# Patient Record
Sex: Female | Born: 1955
Health system: Southern US, Community
[De-identification: ages and names within clinical notes are randomized; demographics above are authoritative.]

## PROBLEM LIST (undated history)

## (undated) DIAGNOSIS — J189 Pneumonia, unspecified organism: Secondary | ICD-10-CM

## (undated) DIAGNOSIS — I82409 Acute embolism and thrombosis of unspecified deep veins of unspecified lower extremity: Secondary | ICD-10-CM

## (undated) DIAGNOSIS — Z87442 Personal history of urinary calculi: Secondary | ICD-10-CM

## (undated) DIAGNOSIS — Z8709 Personal history of other diseases of the respiratory system: Secondary | ICD-10-CM

## (undated) DIAGNOSIS — I739 Peripheral vascular disease, unspecified: Secondary | ICD-10-CM

## (undated) DIAGNOSIS — J449 Chronic obstructive pulmonary disease, unspecified: Secondary | ICD-10-CM

## (undated) DIAGNOSIS — Z8719 Personal history of other diseases of the digestive system: Secondary | ICD-10-CM

## (undated) DIAGNOSIS — Z9889 Other specified postprocedural states: Secondary | ICD-10-CM

## (undated) DIAGNOSIS — I499 Cardiac arrhythmia, unspecified: Secondary | ICD-10-CM

## (undated) DIAGNOSIS — G629 Polyneuropathy, unspecified: Secondary | ICD-10-CM

## (undated) DIAGNOSIS — M1990 Unspecified osteoarthritis, unspecified site: Secondary | ICD-10-CM

## (undated) DIAGNOSIS — L500 Allergic urticaria: Secondary | ICD-10-CM

## (undated) DIAGNOSIS — I480 Paroxysmal atrial fibrillation: Secondary | ICD-10-CM

## (undated) DIAGNOSIS — R112 Nausea with vomiting, unspecified: Secondary | ICD-10-CM

## (undated) DIAGNOSIS — F329 Major depressive disorder, single episode, unspecified: Secondary | ICD-10-CM

## (undated) DIAGNOSIS — I10 Essential (primary) hypertension: Secondary | ICD-10-CM

## (undated) DIAGNOSIS — F32A Depression, unspecified: Secondary | ICD-10-CM

## (undated) DIAGNOSIS — K219 Gastro-esophageal reflux disease without esophagitis: Secondary | ICD-10-CM

## (undated) DIAGNOSIS — M7989 Other specified soft tissue disorders: Secondary | ICD-10-CM

## (undated) HISTORY — PX: CHOLECYSTECTOMY: SHX55

## (undated) HISTORY — PX: ROTATOR CUFF REPAIR: SHX139

## (undated) HISTORY — PX: ABDOMINAL HYSTERECTOMY: SHX81

## (undated) HISTORY — PX: CYSTOSCOPY: SUR368

## (undated) HISTORY — PX: CARPAL TUNNEL RELEASE: SHX101

## (undated) HISTORY — PX: ESOPHAGOGASTRODUODENOSCOPY ENDOSCOPY: SHX5814

## (undated) HISTORY — PX: APPENDECTOMY: SHX54

## (undated) HISTORY — PX: RECONSTRUCTION THUMB W/ TOE: SUR1092

---

## 1985-06-03 HISTORY — PX: BACK SURGERY: SHX140

## 1998-06-03 DIAGNOSIS — I82409 Acute embolism and thrombosis of unspecified deep veins of unspecified lower extremity: Secondary | ICD-10-CM

## 1998-06-03 HISTORY — DX: Acute embolism and thrombosis of unspecified deep veins of unspecified lower extremity: I82.409

## 1998-06-03 HISTORY — PX: ANKLE FRACTURE SURGERY: SHX122

## 1998-06-03 HISTORY — PX: KNEE ARTHROSCOPY: SHX127

## 1998-12-27 ENCOUNTER — Encounter: Payer: Self-pay | Admitting: Internal Medicine

## 1998-12-27 ENCOUNTER — Observation Stay (HOSPITAL_COMMUNITY): Admission: EM | Admit: 1998-12-27 | Discharge: 1998-12-27 | Payer: Self-pay | Admitting: Internal Medicine

## 2001-07-02 ENCOUNTER — Ambulatory Visit (HOSPITAL_COMMUNITY): Admission: RE | Admit: 2001-07-02 | Discharge: 2001-07-02 | Payer: Self-pay | Admitting: *Deleted

## 2001-07-02 ENCOUNTER — Encounter: Payer: Self-pay | Admitting: *Deleted

## 2007-04-10 ENCOUNTER — Encounter: Admission: RE | Admit: 2007-04-10 | Discharge: 2007-04-10 | Payer: Self-pay | Admitting: Unknown Physician Specialty

## 2013-05-25 ENCOUNTER — Encounter (HOSPITAL_COMMUNITY): Payer: Self-pay | Admitting: Pharmacy Technician

## 2013-05-31 ENCOUNTER — Other Ambulatory Visit: Payer: Self-pay | Admitting: Orthopedic Surgery

## 2013-06-02 ENCOUNTER — Encounter (HOSPITAL_COMMUNITY): Payer: Self-pay

## 2013-06-02 ENCOUNTER — Encounter (HOSPITAL_COMMUNITY)
Admission: RE | Admit: 2013-06-02 | Discharge: 2013-06-02 | Disposition: A | Discharge status: Home / Self Care | Payer: Commercial Managed Care - PPO | Source: Ambulatory Visit | Attending: Orthopedic Surgery | Admitting: Orthopedic Surgery

## 2013-06-02 ENCOUNTER — Ambulatory Visit (HOSPITAL_COMMUNITY)
Admission: RE | Admit: 2013-06-02 | Discharge: 2013-06-02 | Disposition: A | Discharge status: Home / Self Care | Payer: Commercial Managed Care - PPO | Source: Ambulatory Visit | Attending: Orthopedic Surgery | Admitting: Orthopedic Surgery

## 2013-06-02 DIAGNOSIS — Z01818 Encounter for other preprocedural examination: Secondary | ICD-10-CM | POA: Insufficient documentation

## 2013-06-02 DIAGNOSIS — Z01812 Encounter for preprocedural laboratory examination: Secondary | ICD-10-CM | POA: Insufficient documentation

## 2013-06-02 HISTORY — DX: Gastro-esophageal reflux disease without esophagitis: K21.9

## 2013-06-02 HISTORY — DX: Acute embolism and thrombosis of unspecified deep veins of unspecified lower extremity: I82.409

## 2013-06-02 HISTORY — DX: Other specified soft tissue disorders: M79.89

## 2013-06-02 HISTORY — DX: Allergic urticaria: L50.0

## 2013-06-02 HISTORY — DX: Personal history of other diseases of the digestive system: Z87.19

## 2013-06-02 HISTORY — DX: Pneumonia, unspecified organism: J18.9

## 2013-06-02 HISTORY — DX: Depression, unspecified: F32.A

## 2013-06-02 HISTORY — DX: Personal history of other diseases of the respiratory system: Z87.09

## 2013-06-02 HISTORY — DX: Personal history of urinary calculi: Z87.442

## 2013-06-02 HISTORY — DX: Chronic obstructive pulmonary disease, unspecified: J44.9

## 2013-06-02 HISTORY — DX: Unspecified osteoarthritis, unspecified site: M19.90

## 2013-06-02 HISTORY — DX: Nausea with vomiting, unspecified: Z98.890

## 2013-06-02 HISTORY — DX: Nausea with vomiting, unspecified: R11.2

## 2013-06-02 HISTORY — DX: Major depressive disorder, single episode, unspecified: F32.9

## 2013-06-02 HISTORY — DX: Essential (primary) hypertension: I10

## 2013-06-02 LAB — CBC
HCT: 40.5 % (ref 36.0–46.0)
Hemoglobin: 12.8 g/dL (ref 12.0–15.0)
MCH: 28 pg (ref 26.0–34.0)
MCHC: 31.6 g/dL (ref 30.0–36.0)
MCV: 88.6 fL (ref 78.0–100.0)
Platelets: 305 10*3/uL (ref 150–400)
RBC: 4.57 MIL/uL (ref 3.87–5.11)
RDW: 13.4 % (ref 11.5–15.5)
WBC: 10.1 10*3/uL (ref 4.0–10.5)

## 2013-06-02 LAB — URINALYSIS, ROUTINE W REFLEX MICROSCOPIC
Bilirubin Urine: NEGATIVE
Glucose, UA: NEGATIVE mg/dL
Hgb urine dipstick: NEGATIVE
Ketones, ur: NEGATIVE mg/dL
Leukocytes, UA: NEGATIVE
Nitrite: NEGATIVE
Protein, ur: NEGATIVE mg/dL
Specific Gravity, Urine: 1.018 (ref 1.005–1.030)
Urobilinogen, UA: 0.2 mg/dL (ref 0.0–1.0)
pH: 6 (ref 5.0–8.0)

## 2013-06-02 LAB — PROTIME-INR
INR: 0.94 (ref 0.00–1.49)
Prothrombin Time: 12.4 seconds (ref 11.6–15.2)

## 2013-06-02 LAB — COMPREHENSIVE METABOLIC PANEL
ALT: 26 U/L (ref 0–35)
AST: 17 U/L (ref 0–37)
Albumin: 3.7 g/dL (ref 3.5–5.2)
Alkaline Phosphatase: 92 U/L (ref 39–117)
BUN: 13 mg/dL (ref 6–23)
CO2: 26 mEq/L (ref 19–32)
Calcium: 9.2 mg/dL (ref 8.4–10.5)
Chloride: 99 mEq/L (ref 96–112)
Creatinine, Ser: 0.62 mg/dL (ref 0.50–1.10)
GFR calc Af Amer: >90 mL/min (ref >90)
GFR calc non Af Amer: >90 mL/min (ref >90)
Glucose, Bld: 126 mg/dL — ABNORMAL HIGH (ref 70–99)
Potassium: 4.1 mEq/L (ref 3.7–5.3)
Sodium: 139 mEq/L (ref 137–147)
Total Bilirubin: 0.4 mg/dL (ref 0.3–1.2)
Total Protein: 6.8 g/dL (ref 6.0–8.3)

## 2013-06-02 LAB — APTT: aPTT: 28 seconds (ref 24–37)

## 2013-06-02 LAB — SURGICAL PCR SCREEN
MRSA, PCR: NEGATIVE
Staphylococcus aureus: NEGATIVE

## 2013-06-02 NOTE — Progress Notes (Signed)
Surgery clearance note 05/13/13 Dr. Nedra Hai on chart, EKG 05/13/13 on chart

## 2013-06-02 NOTE — Patient Instructions (Signed)
20 Hailey Stanton  06/02/2013   Your procedure is scheduled on: 06/11/13  Report to [facility] at 6:45 AM.  Call this number if you have problems the morning of surgery [phone]: [phone]   Remember:   Do not eat food or drink liquids After Midnight.     Take these medicines the morning of surgery with A SIP OF WATER: zyrtec, prilosec   Do not wear jewelry, make-up or nail polish.  Do not wear lotions, powders, or perfumes. You may wear deodorant.  Do not shave 48 hours prior to surgery. Men may shave face and neck.  Do not bring valuables to the hospital.  Contacts, dentures or bridgework may not be worn into surgery.  Leave suitcase in the car. After surgery it may be brought to your room.  For patients admitted to the hospital, checkout time is 11:00 AM the day of discharge.    Please read over the following fact sheets that you were given: MRSA Information, incentive spirometry fact sheet, blood fact sheet Birdie Sons, RN  pre op nurse call if needed [phone]    FAILURE TO FOLLOW THESE INSTRUCTIONS MAY RESULT IN CANCELLATION OF YOUR SURGERY   Patient Signature: ___________________________________________

## 2013-06-10 ENCOUNTER — Other Ambulatory Visit: Payer: Self-pay | Admitting: Orthopedic Surgery

## 2013-06-10 MED ORDER — VANCOMYCIN HCL 10 G IV SOLR
1500.0000 mg | INTRAVENOUS | Status: AC
  Administered 2013-06-11: 1500 mg via INTRAVENOUS
  Filled 2013-06-10 (×2): qty 1500
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
## 2013-06-11 ENCOUNTER — Inpatient Hospital Stay (HOSPITAL_COMMUNITY): Payer: Commercial Managed Care - PPO | Admitting: Anesthesiology

## 2013-06-11 ENCOUNTER — Encounter (HOSPITAL_COMMUNITY): Payer: Self-pay | Admitting: *Deleted

## 2013-06-11 ENCOUNTER — Encounter (HOSPITAL_COMMUNITY): Payer: Commercial Managed Care - PPO | Admitting: Anesthesiology

## 2013-06-11 ENCOUNTER — Encounter (HOSPITAL_COMMUNITY)
Admission: RE | Disposition: A | Discharge status: Home Health | Payer: Self-pay | Source: Ambulatory Visit | Attending: Orthopedic Surgery

## 2013-06-11 ENCOUNTER — Inpatient Hospital Stay (HOSPITAL_COMMUNITY)
Admission: RE | Admit: 2013-06-11 | Discharge: 2013-06-14 | DRG: 470 | Disposition: A | Discharge status: Home Health | Payer: Commercial Managed Care - PPO | Source: Ambulatory Visit | Attending: Orthopedic Surgery | Admitting: Orthopedic Surgery

## 2013-06-11 DIAGNOSIS — Z6841 Body Mass Index (BMI) 40.0 and over, adult: Secondary | ICD-10-CM

## 2013-06-11 DIAGNOSIS — Z86718 Personal history of other venous thrombosis and embolism: Secondary | ICD-10-CM

## 2013-06-11 DIAGNOSIS — J4489 Other specified chronic obstructive pulmonary disease: Secondary | ICD-10-CM | POA: Diagnosis present

## 2013-06-11 DIAGNOSIS — M898X9 Other specified disorders of bone, unspecified site: Secondary | ICD-10-CM | POA: Diagnosis present

## 2013-06-11 DIAGNOSIS — K449 Diaphragmatic hernia without obstruction or gangrene: Secondary | ICD-10-CM | POA: Diagnosis present

## 2013-06-11 DIAGNOSIS — D62 Acute posthemorrhagic anemia: Secondary | ICD-10-CM

## 2013-06-11 DIAGNOSIS — M1710 Unilateral primary osteoarthritis, unspecified knee: Principal | ICD-10-CM | POA: Diagnosis present

## 2013-06-11 DIAGNOSIS — Z96651 Presence of right artificial knee joint: Secondary | ICD-10-CM

## 2013-06-11 DIAGNOSIS — Z87891 Personal history of nicotine dependence: Secondary | ICD-10-CM

## 2013-06-11 DIAGNOSIS — M179 Osteoarthritis of knee, unspecified: Secondary | ICD-10-CM | POA: Diagnosis present

## 2013-06-11 DIAGNOSIS — L500 Allergic urticaria: Secondary | ICD-10-CM | POA: Diagnosis present

## 2013-06-11 DIAGNOSIS — Z881 Allergy status to other antibiotic agents status: Secondary | ICD-10-CM

## 2013-06-11 DIAGNOSIS — F329 Major depressive disorder, single episode, unspecified: Secondary | ICD-10-CM | POA: Diagnosis present

## 2013-06-11 DIAGNOSIS — Z79899 Other long term (current) drug therapy: Secondary | ICD-10-CM

## 2013-06-11 DIAGNOSIS — I10 Essential (primary) hypertension: Secondary | ICD-10-CM | POA: Diagnosis present

## 2013-06-11 DIAGNOSIS — J449 Chronic obstructive pulmonary disease, unspecified: Secondary | ICD-10-CM | POA: Diagnosis present

## 2013-06-11 DIAGNOSIS — F3289 Other specified depressive episodes: Secondary | ICD-10-CM | POA: Diagnosis present

## 2013-06-11 DIAGNOSIS — K219 Gastro-esophageal reflux disease without esophagitis: Secondary | ICD-10-CM | POA: Diagnosis present

## 2013-06-11 HISTORY — DX: Osteoarthritis of knee, unspecified: M17.9

## 2013-06-11 HISTORY — PX: TOTAL KNEE ARTHROPLASTY: SHX125

## 2013-06-11 LAB — TYPE AND SCREEN
ABO/RH(D): O NEG
Antibody Screen: NEGATIVE

## 2013-06-11 LAB — ABO/RH: ABO/RH(D): O NEG

## 2013-06-11 SURGERY — ARTHROPLASTY, KNEE, TOTAL
Anesthesia: Spinal | Site: Knee | Laterality: Right

## 2013-06-11 MED ORDER — VALSARTAN-HYDROCHLOROTHIAZIDE 80-12.5 MG PO TABS: 1.0000 | ORAL_TABLET | Freq: Every day | ORAL | Status: DC

## 2013-06-11 MED ORDER — PROPOFOL 10 MG/ML IV BOLUS
INTRAVENOUS | Status: AC
  Filled 2013-06-11: qty 20

## 2013-06-11 MED ORDER — ACETAMINOPHEN 500 MG PO TABS
1000.0000 mg | ORAL_TABLET | Freq: Once | ORAL | Status: AC
  Administered 2013-06-11: 1000 mg via ORAL
  Filled 2013-06-11: qty 2

## 2013-06-11 MED ORDER — 0.9 % SODIUM CHLORIDE (POUR BTL) OPTIME
TOPICAL | Status: DC | PRN
  Administered 2013-06-11: 1000 mL

## 2013-06-11 MED ORDER — ONDANSETRON HCL 4 MG/2ML IJ SOLN
4.0000 mg | Freq: Four times a day (QID) | INTRAMUSCULAR | Status: DC | PRN
  Administered 2013-06-12: 4 mg via INTRAVENOUS
  Filled 2013-06-11: qty 2

## 2013-06-11 MED ORDER — ACETAMINOPHEN 650 MG RE SUPP: 650.0000 mg | Freq: Four times a day (QID) | RECTAL | Status: DC | PRN

## 2013-06-11 MED ORDER — DEXAMETHASONE SODIUM PHOSPHATE 10 MG/ML IJ SOLN
10.0000 mg | Freq: Every day | INTRAMUSCULAR | Status: AC
  Filled 2013-06-11: qty 1

## 2013-06-11 MED ORDER — ONDANSETRON HCL 4 MG/2ML IJ SOLN
INTRAMUSCULAR | Status: DC | PRN
  Administered 2013-06-11: 4 mg via INTRAVENOUS

## 2013-06-11 MED ORDER — DEXAMETHASONE SODIUM PHOSPHATE 10 MG/ML IJ SOLN
INTRAMUSCULAR | Status: AC
  Filled 2013-06-11: qty 1

## 2013-06-11 MED ORDER — LACTATED RINGERS IV SOLN
INTRAVENOUS | Status: DC
  Administered 2013-06-11: 1000 mL via INTRAVENOUS
  Administered 2013-06-11: 10:00:00 via INTRAVENOUS

## 2013-06-11 MED ORDER — PROPOFOL 10 MG/ML IV BOLUS
INTRAVENOUS | Status: DC | PRN
  Administered 2013-06-11: 30 mg via INTRAVENOUS

## 2013-06-11 MED ORDER — BUPIVACAINE IN DEXTROSE 0.75-8.25 % IT SOLN
INTRATHECAL | Status: DC | PRN
  Administered 2013-06-11: 2 mL via INTRATHECAL

## 2013-06-11 MED ORDER — VANCOMYCIN HCL IN DEXTROSE 1-5 GM/200ML-% IV SOLN
1000.0000 mg | Freq: Two times a day (BID) | INTRAVENOUS | Status: AC
  Administered 2013-06-11: 1000 mg via INTRAVENOUS
  Filled 2013-06-11: qty 200

## 2013-06-11 MED ORDER — NEOSTIGMINE METHYLSULFATE 1 MG/ML IJ SOLN
INTRAMUSCULAR | Status: AC
  Filled 2013-06-11: qty 10

## 2013-06-11 MED ORDER — FUROSEMIDE 20 MG PO TABS
20.0000 mg | ORAL_TABLET | Freq: Every day | ORAL | Status: DC
  Administered 2013-06-11 – 2013-06-13 (×3): 20 mg via ORAL
  Filled 2013-06-11 (×4): qty 1

## 2013-06-11 MED ORDER — PHENOL 1.4 % MT LIQD: 1.0000 | OROMUCOSAL | Status: DC | PRN

## 2013-06-11 MED ORDER — LORATADINE 10 MG PO TABS
10.0000 mg | ORAL_TABLET | Freq: Every day | ORAL | Status: DC
  Administered 2013-06-11 – 2013-06-14 (×4): 10 mg via ORAL
  Filled 2013-06-11 (×4): qty 1

## 2013-06-11 MED ORDER — PANTOPRAZOLE SODIUM 40 MG PO TBEC
40.0000 mg | DELAYED_RELEASE_TABLET | Freq: Every day | ORAL | Status: DC
  Administered 2013-06-12 – 2013-06-14 (×3): 40 mg via ORAL
  Filled 2013-06-11 (×3): qty 1

## 2013-06-11 MED ORDER — PROMETHAZINE HCL 25 MG/ML IJ SOLN: 6.2500 mg | INTRAMUSCULAR | Status: DC | PRN

## 2013-06-11 MED ORDER — BUPIVACAINE HCL 0.25 % IJ SOLN
INTRAMUSCULAR | Status: DC | PRN
  Administered 2013-06-11: 20 mL

## 2013-06-11 MED ORDER — METOCLOPRAMIDE HCL 5 MG/ML IJ SOLN: 5.0000 mg | Freq: Three times a day (TID) | INTRAMUSCULAR | Status: DC | PRN

## 2013-06-11 MED ORDER — ONDANSETRON HCL 4 MG/2ML IJ SOLN
INTRAMUSCULAR | Status: AC
  Filled 2013-06-11: qty 2

## 2013-06-11 MED ORDER — ONDANSETRON HCL 4 MG PO TABS: 4.0000 mg | ORAL_TABLET | Freq: Four times a day (QID) | ORAL | Status: DC | PRN

## 2013-06-11 MED ORDER — OXYCODONE HCL 5 MG PO TABS
5.0000 mg | ORAL_TABLET | ORAL | Status: DC | PRN
  Administered 2013-06-11: 10 mg via ORAL
  Administered 2013-06-11 (×2): 5 mg via ORAL
  Administered 2013-06-12 – 2013-06-14 (×12): 10 mg via ORAL
  Filled 2013-06-11 (×11): qty 2
  Filled 2013-06-11 (×2): qty 1
  Filled 2013-06-11 (×2): qty 2

## 2013-06-11 MED ORDER — SODIUM CHLORIDE 0.9 % IJ SOLN
INTRAMUSCULAR | Status: AC
  Filled 2013-06-11: qty 50

## 2013-06-11 MED ORDER — MORPHINE SULFATE 2 MG/ML IJ SOLN
1.0000 mg | INTRAMUSCULAR | Status: DC | PRN
  Administered 2013-06-11 (×2): 1 mg via INTRAVENOUS
  Filled 2013-06-11 (×2): qty 1

## 2013-06-11 MED ORDER — BUPIVACAINE HCL (PF) 0.25 % IJ SOLN
INTRAMUSCULAR | Status: AC
  Filled 2013-06-11: qty 30

## 2013-06-11 MED ORDER — BUPIVACAINE LIPOSOME 1.3 % IJ SUSP
INTRAMUSCULAR | Status: DC | PRN
  Administered 2013-06-11: 20 mL

## 2013-06-11 MED ORDER — LORATADINE 10 MG PO TABS: 10.0000 mg | ORAL_TABLET | Freq: Every day | ORAL | Status: DC

## 2013-06-11 MED ORDER — IRBESARTAN 75 MG PO TABS
75.0000 mg | ORAL_TABLET | Freq: Every day | ORAL | Status: DC
  Administered 2013-06-12 – 2013-06-14 (×3): 75 mg via ORAL
  Filled 2013-06-11 (×3): qty 1

## 2013-06-11 MED ORDER — BUPIVACAINE LIPOSOME 1.3 % IJ SUSP
20.0000 mL | Freq: Once | INTRAMUSCULAR | Status: DC
  Filled 2013-06-11: qty 20

## 2013-06-11 MED ORDER — SODIUM CHLORIDE 0.9 % IJ SOLN
INTRAMUSCULAR | Status: DC | PRN
  Administered 2013-06-11: 30 mL

## 2013-06-11 MED ORDER — MENTHOL 3 MG MT LOZG: 1.0000 | LOZENGE | OROMUCOSAL | Status: DC | PRN

## 2013-06-11 MED ORDER — FENTANYL CITRATE 0.05 MG/ML IJ SOLN
INTRAMUSCULAR | Status: AC
  Filled 2013-06-11: qty 5

## 2013-06-11 MED ORDER — METHOCARBAMOL 500 MG PO TABS
500.0000 mg | ORAL_TABLET | Freq: Four times a day (QID) | ORAL | Status: DC | PRN
  Administered 2013-06-11 – 2013-06-14 (×8): 500 mg via ORAL
  Filled 2013-06-11 (×9): qty 1

## 2013-06-11 MED ORDER — HYDROCHLOROTHIAZIDE 12.5 MG PO CAPS
12.5000 mg | ORAL_CAPSULE | Freq: Every day | ORAL | Status: DC
  Administered 2013-06-12 – 2013-06-14 (×3): 12.5 mg via ORAL
  Filled 2013-06-11 (×3): qty 1

## 2013-06-11 MED ORDER — DEXAMETHASONE SODIUM PHOSPHATE 10 MG/ML IJ SOLN
10.0000 mg | Freq: Once | INTRAMUSCULAR | Status: AC
  Administered 2013-06-11: 10 mg via INTRAVENOUS

## 2013-06-11 MED ORDER — METHOCARBAMOL 100 MG/ML IJ SOLN
500.0000 mg | Freq: Four times a day (QID) | INTRAVENOUS | Status: DC | PRN
  Administered 2013-06-11: 500 mg via INTRAVENOUS
  Filled 2013-06-11: qty 5

## 2013-06-11 MED ORDER — DOCUSATE SODIUM 100 MG PO CAPS
100.0000 mg | ORAL_CAPSULE | Freq: Two times a day (BID) | ORAL | Status: DC
  Administered 2013-06-11 – 2013-06-14 (×6): 100 mg via ORAL

## 2013-06-11 MED ORDER — PROPOFOL INFUSION 10 MG/ML OPTIME
INTRAVENOUS | Status: DC | PRN
  Administered 2013-06-11: 140 ug/kg/min via INTRAVENOUS

## 2013-06-11 MED ORDER — POLYETHYLENE GLYCOL 3350 17 G PO PACK
17.0000 g | PACK | Freq: Every day | ORAL | Status: DC | PRN
  Administered 2013-06-12 – 2013-06-13 (×2): 17 g via ORAL

## 2013-06-11 MED ORDER — ACETAMINOPHEN 500 MG PO TABS
1000.0000 mg | ORAL_TABLET | Freq: Four times a day (QID) | ORAL | Status: AC
  Administered 2013-06-11 – 2013-06-12 (×4): 1000 mg via ORAL
  Filled 2013-06-11 (×4): qty 2

## 2013-06-11 MED ORDER — BISACODYL 10 MG RE SUPP: 10.0000 mg | Freq: Every day | RECTAL | Status: DC | PRN

## 2013-06-11 MED ORDER — DEXAMETHASONE 6 MG PO TABS
10.0000 mg | ORAL_TABLET | Freq: Every day | ORAL | Status: AC
  Administered 2013-06-12: 10 mg via ORAL
  Filled 2013-06-11: qty 1

## 2013-06-11 MED ORDER — SODIUM CHLORIDE 0.9 % IV SOLN
INTRAVENOUS | Status: DC
  Administered 2013-06-11: 75 mL/h via INTRAVENOUS
  Administered 2013-06-12: 02:00:00 via INTRAVENOUS

## 2013-06-11 MED ORDER — ROCURONIUM BROMIDE 100 MG/10ML IV SOLN
INTRAVENOUS | Status: AC
  Filled 2013-06-11: qty 1

## 2013-06-11 MED ORDER — METOCLOPRAMIDE HCL 10 MG PO TABS
5.0000 mg | ORAL_TABLET | Freq: Three times a day (TID) | ORAL | Status: DC | PRN
  Administered 2013-06-12: 10 mg via ORAL
  Filled 2013-06-11: qty 1

## 2013-06-11 MED ORDER — DIPHENHYDRAMINE HCL 12.5 MG/5ML PO ELIX: 12.5000 mg | ORAL_SOLUTION | ORAL | Status: DC | PRN

## 2013-06-11 MED ORDER — HYDROMORPHONE HCL PF 1 MG/ML IJ SOLN
0.5000 mg | INTRAMUSCULAR | Status: DC | PRN
  Administered 2013-06-11 (×2): 0.5 mg via INTRAVENOUS
  Administered 2013-06-12 (×2): 1 mg via INTRAVENOUS
  Filled 2013-06-11 (×3): qty 1

## 2013-06-11 MED ORDER — RIVAROXABAN 10 MG PO TABS
10.0000 mg | ORAL_TABLET | Freq: Every day | ORAL | Status: DC
  Administered 2013-06-12 – 2013-06-14 (×3): 10 mg via ORAL
  Filled 2013-06-11 (×5): qty 1

## 2013-06-11 MED ORDER — HYDROMORPHONE HCL PF 1 MG/ML IJ SOLN: 0.2500 mg | INTRAMUSCULAR | Status: DC | PRN

## 2013-06-11 MED ORDER — GLYCOPYRROLATE 0.2 MG/ML IJ SOLN
INTRAMUSCULAR | Status: AC
  Filled 2013-06-11: qty 3

## 2013-06-11 MED ORDER — FLEET ENEMA 7-19 GM/118ML RE ENEM: 1.0000 | ENEMA | Freq: Once | RECTAL | Status: AC | PRN

## 2013-06-11 MED ORDER — KETOROLAC TROMETHAMINE 15 MG/ML IJ SOLN
7.5000 mg | Freq: Four times a day (QID) | INTRAMUSCULAR | Status: AC | PRN
  Administered 2013-06-11: 7.5 mg via INTRAVENOUS
  Filled 2013-06-11: qty 1

## 2013-06-11 MED ORDER — SODIUM CHLORIDE 0.9 % IV SOLN: INTRAVENOUS | Status: DC

## 2013-06-11 MED ORDER — SODIUM CHLORIDE 0.9 % IR SOLN
Status: DC | PRN
  Administered 2013-06-11: 1000 mL

## 2013-06-11 MED ORDER — ACETAMINOPHEN 325 MG PO TABS
650.0000 mg | ORAL_TABLET | Freq: Four times a day (QID) | ORAL | Status: DC | PRN
  Administered 2013-06-13: 650 mg via ORAL
  Filled 2013-06-11: qty 2

## 2013-06-11 MED ORDER — TRAMADOL HCL 50 MG PO TABS
50.0000 mg | ORAL_TABLET | Freq: Four times a day (QID) | ORAL | Status: DC | PRN
  Administered 2013-06-12 – 2013-06-13 (×2): 100 mg via ORAL
  Filled 2013-06-11 (×2): qty 2

## 2013-06-11 SURGICAL SUPPLY — 57 items
BAG ZIPLOCK 12X15 (MISCELLANEOUS) ×2 IMPLANT
BANDAGE ELASTIC 6 VELCRO ST LF (GAUZE/BANDAGES/DRESSINGS) ×2 IMPLANT
BANDAGE ESMARK 6X9 LF (GAUZE/BANDAGES/DRESSINGS) ×1 IMPLANT
BLADE SAG 18X100X1.27 (BLADE) ×2 IMPLANT
BLADE SAW SGTL 11.0X1.19X90.0M (BLADE) ×2 IMPLANT
BNDG ESMARK 6X9 LF (GAUZE/BANDAGES/DRESSINGS) ×2
BOWL SMART MIX CTS (DISPOSABLE) ×2 IMPLANT
CAP KNEE ATTUNE RP ×2 IMPLANT
CEMENT HV SMART SET (Cement) ×4 IMPLANT
CUFF TOURN SGL QUICK 34 (TOURNIQUET CUFF) ×2
CUFF TRNQT CYL 34X4X40X1 (TOURNIQUET CUFF) ×1 IMPLANT
DECANTER SPIKE VIAL GLASS SM (MISCELLANEOUS) ×2 IMPLANT
DRAPE EXTREMITY T 121X128X90 (DRAPE) ×2 IMPLANT
DRAPE POUCH INSTRU U-SHP 10X18 (DRAPES) ×2 IMPLANT
DRAPE U-SHAPE 47X51 STRL (DRAPES) ×2 IMPLANT
DRSG ADAPTIC 3X8 NADH LF (GAUZE/BANDAGES/DRESSINGS) ×2 IMPLANT
DURAPREP 26ML APPLICATOR (WOUND CARE) ×2 IMPLANT
ELECT REM PT RETURN 9FT ADLT (ELECTROSURGICAL) ×2
ELECTRODE REM PT RTRN 9FT ADLT (ELECTROSURGICAL) ×1 IMPLANT
EVACUATOR 1/8 PVC DRAIN (DRAIN) ×2 IMPLANT
FACESHIELD LNG OPTICON STERILE (SAFETY) ×10 IMPLANT
GLOVE BIO SURGEON STRL SZ7.5 (GLOVE) ×2 IMPLANT
GLOVE BIO SURGEON STRL SZ8 (GLOVE) ×2 IMPLANT
GLOVE BIOGEL PI IND STRL 7.5 (GLOVE) ×1 IMPLANT
GLOVE BIOGEL PI IND STRL 8 (GLOVE) ×2 IMPLANT
GLOVE BIOGEL PI INDICATOR 7.5 (GLOVE) ×1
GLOVE BIOGEL PI INDICATOR 8 (GLOVE) ×2
GLOVE SURG SS PI 6.5 STRL IVOR (GLOVE) IMPLANT
GLOVE SURG SS PI 7.5 STRL IVOR (GLOVE) ×6 IMPLANT
GOWN STRL REIN 3XL XLG LVL4 (GOWN DISPOSABLE) ×2 IMPLANT
GOWN STRL REUS W/TWL LRG LVL3 (GOWN DISPOSABLE) ×2 IMPLANT
GOWN STRL REUS W/TWL XL LVL3 (GOWN DISPOSABLE) ×4 IMPLANT
HANDPIECE INTERPULSE COAX TIP (DISPOSABLE) ×2
IMMOBILIZER KNEE 20 (SOFTGOODS) ×2 IMPLANT
KIT BASIN OR (CUSTOM PROCEDURE TRAY) ×2 IMPLANT
MANIFOLD NEPTUNE II (INSTRUMENTS) ×2 IMPLANT
NDL SAFETY ECLIPSE 18X1.5 (NEEDLE) ×2 IMPLANT
NEEDLE HYPO 18GX1.5 SHARP (NEEDLE) ×4
NS IRRIG 1000ML POUR BTL (IV SOLUTION) ×2 IMPLANT
PACK TOTAL JOINT (CUSTOM PROCEDURE TRAY) ×2 IMPLANT
PAD ABD 8X10 STRL (GAUZE/BANDAGES/DRESSINGS) ×2 IMPLANT
PADDING CAST COTTON 6X4 STRL (CAST SUPPLIES) ×6 IMPLANT
POSITIONER SURGICAL ARM (MISCELLANEOUS) ×2 IMPLANT
SET HNDPC FAN SPRY TIP SCT (DISPOSABLE) ×1 IMPLANT
SPONGE GAUZE 4X4 12PLY (GAUZE/BANDAGES/DRESSINGS) ×2 IMPLANT
STRIP CLOSURE SKIN 1/2X4 (GAUZE/BANDAGES/DRESSINGS) ×2 IMPLANT
SUCTION FRAZIER 12FR DISP (SUCTIONS) ×2 IMPLANT
SUT MNCRL AB 4-0 PS2 18 (SUTURE) ×2 IMPLANT
SUT VIC AB 2-0 CT1 27 (SUTURE) ×6
SUT VIC AB 2-0 CT1 TAPERPNT 27 (SUTURE) ×3 IMPLANT
SUT VLOC 180 0 24IN GS25 (SUTURE) ×2 IMPLANT
SYR 20CC LL (SYRINGE) ×2 IMPLANT
SYR 50ML LL SCALE MARK (SYRINGE) ×2 IMPLANT
TOWEL OR 17X26 10 PK STRL BLUE (TOWEL DISPOSABLE) ×4 IMPLANT
TRAY FOLEY CATH 14FRSI W/METER (CATHETERS) ×2 IMPLANT
WATER STERILE IRR 1500ML POUR (IV SOLUTION) ×4 IMPLANT
WRAP KNEE MAXI GEL POST OP (GAUZE/BANDAGES/DRESSINGS) ×2 IMPLANT

## 2013-06-11 NOTE — Anesthesia Postprocedure Evaluation (Signed)
  Anesthesia Post-op Note  Patient: Hailey Stanton  Procedure(s) Performed: Procedure(s) (LRB): RIGHT TOTAL KNEE ARTHROPLASTY (Right)  Patient Location: PACU  Anesthesia Type: Spinal  Level of Consciousness: awake and alert   Airway and Oxygen Therapy: Patient Spontanous Breathing  Post-op Pain: mild  Post-op Assessment: Post-op Vital signs reviewed, Patient's Cardiovascular Status Stable, Respiratory Function Stable, Patent Airway and No signs of Nausea or vomiting  Last Vitals:  Filed Vitals:   06/11/13 1515  BP: 109/66  Pulse: 86  Temp: 36.6 C  Resp: 16    Post-op Vital Signs: stable   Complications: No apparent anesthesia complications

## 2013-06-11 NOTE — Anesthesia Preprocedure Evaluation (Addendum)
Anesthesia Evaluation  Patient identified by MRN, date of birth, ID band Patient awake    Reviewed: Allergy & Precautions, H&P , NPO status , Patient's Chart, lab work & pertinent test results  History of Anesthesia Complications (+) PONV and history of anesthetic complications  Airway Mallampati: II TM Distance: >3 FB Neck ROM: Full    Dental no notable dental hx.    Pulmonary pneumonia -, COPDformer smoker,  CXR: No active disease. breath sounds clear to auscultation  Pulmonary exam normal       Cardiovascular hypertension, Pt. on medications Rhythm:Regular Rate:Normal     Neuro/Psych PSYCHIATRIC DISORDERS Depression Chronic back pain.    GI/Hepatic Neg liver ROS, hiatal hernia, GERD-  Medicated,  Endo/Other  Morbid obesity  Renal/GU negative Renal ROS  negative genitourinary   Musculoskeletal negative musculoskeletal ROS (+)   Abdominal (+) + obese,   Peds negative pediatric ROS (+)  Hematology negative hematology ROS (+)   Anesthesia Other Findings   Reproductive/Obstetrics negative OB ROS                          Anesthesia Physical Anesthesia Plan  ASA: III  Anesthesia Plan: Spinal   Post-op Pain Management:    Induction: Intravenous  Airway Management Planned:   Additional Equipment:   Intra-op Plan:   Post-operative Plan:   Informed Consent: I have reviewed the patients History and Physical, chart, labs and discussed the procedure including the risks, benefits and alternatives for the proposed anesthesia with the patient or authorized representative who has indicated his/her understanding and acceptance.   Dental advisory given  Plan Discussed with: CRNA  Anesthesia Plan Comments: (Discussed r/b general versus spinal. Discussed risks/benefits of spinal including headache, backache, failure, bleeding, infection, and nerve damage. Patient consents to spinal. Questions  answered. Coagulation studies and platelet count acceptable.  H/O severe PONV with general.)       Anesthesia Quick Evaluation

## 2013-06-11 NOTE — Interval H&P Note (Signed)
History and Physical Interval Note:  06/11/2013 8:34 AM  Hailey Stanton  has presented today for surgery, with the diagnosis of OA RIGHT KNEE  The various methods of treatment have been discussed with the patient and family. After consideration of risks, benefits and other options for treatment, the patient has consented to  Procedure(s): RIGHT TOTAL KNEE ARTHROPLASTY (Right) as a surgical intervention .  The patient's history has been reviewed, patient examined, no change in status, stable for surgery.  I have reviewed the patient's chart and labs.  Questions were answered to the patient's satisfaction.     Loanne DrillingALUISIO,Carden Teel V

## 2013-06-11 NOTE — Care Management Note (Addendum)
    Page 1 of 2   06/14/2013     12:50:35 PM   CARE MANAGEMENT NOTE 06/14/2013  Patient:  Hailey Stanton,Hailey Stanton   Account Number:  [id number]  Date Initiated:  06/11/2013  Documentation initiated by:  Colleen CanMANNING,Harinder Romas  Subjective/Objective Assessment:   dx tota;l knee replacemnt-rt     Action/Plan:   Surgery today. PT/OT pending. CM will follow.   Anticipated DC Date:  06/14/2013   Anticipated DC Plan:  HOME W HOME HEALTH SERVICES      DC Planning Services  CM consult      PAC Choice  DURABLE MEDICAL EQUIPMENT  HOME HEALTH   Choice offered to / List presented to:  C-1 Patient   DME arranged  [facility]  3-N-1      DME agency  [facility]     HH arranged  HH-2 PT      [facility]   Status of service:  Completed, signed off Medicare Important Message given?   (If response is "NO", the following Medicare IM given date fields will be blank) Date Medicare IM given:   Date Additional Medicare IM given:    Discharge Disposition:  HOME W HOME HEALTH SERVICES  Per UR Regulation:  Reviewed for med. necessity/level of care/duration of stay  If discussed at Long Length of Stay Meetings, dates discussed:    Comments:  06/14/2013 Colleen CanLinda Alphia Behanna BSN RN CCM [phone] Plans are for pt discharge today. She [place] spouse will be caregiver. States DME was delivered to her room and husband has already transpoirted to her home. [facility] with start date of tomorrow 01/13.   06/12/13 KATHY MAHABIR RN,BSN NCM [phone] [facility] REP JAMIE AWARE OF HHPT ORDER,& FOLLOWING FOR D/C ORDER.RECOMMENDED FOR [facility] RW,AWAIT ORDER.  POD#1 R TKA.PT-HH, [facility] RW.

## 2013-06-11 NOTE — Transfer of Care (Signed)
Immediate Anesthesia Transfer of Care Note  Patient: Hailey Stanton  Procedure(s) Performed: Procedure(s): RIGHT TOTAL KNEE ARTHROPLASTY (Right)  Patient Location: PACU  Anesthesia Type:Spinal  Level of Consciousness: awake, alert , oriented and patient cooperative  Airway & Oxygen Therapy: Patient Spontanous Breathing and Patient connected to face mask oxygen  Post-op Assessment: Report given to PACU RN and Post -op Vital signs reviewed and stable  Post vital signs: Reviewed and stable  Complications: No apparent anesthesia complications

## 2013-06-11 NOTE — Anesthesia Procedure Notes (Signed)
Spinal  Patient location during procedure: OR Staffing Anesthesiologist: Salley Scarlet Performed by: anesthesiologist  Preanesthetic Checklist Completed: patient identified, site marked, surgical consent, pre-op evaluation, timeout performed, IV checked, risks and benefits discussed and monitors and equipment checked Spinal Block Patient position: sitting Prep: Betadine Patient monitoring: heart rate, continuous pulse ox and blood pressure Location: L3-4 Injection technique: single-shot Needle Needle type: Sprotte  Needle gauge: 24 G Needle length: 10 cm Additional Notes Expiration date of kit checked and confirmed. Patient tolerated procedure well, without complications. CSF clear. No paresthesia.

## 2013-06-11 NOTE — Preoperative (Signed)
Beta Blockers   Reason not to administer Beta Blockers:Not Applicable 
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
## 2013-06-11 NOTE — Op Note (Signed)
Pre-operative diagnosis- Osteoarthritis  Right knee(s)  Post-operative diagnosis- Osteoarthritis Right knee(s)  Procedure-  Right  Total Knee Arthroplasty  Surgeon- Gus Rankin. Kip Kautzman, MD  Assistant- Avel Peace, PA-C   Anesthesia-  Spinal EBL-* No blood loss amount entered *  Drains Hemovac  Tourniquet time-  Total Tourniquet Time Documented: Thigh (Right) - 48 minutes Total: Thigh (Right) - 48 minutes    Complications- None  Condition-PACU - hemodynamically stable.   Brief Clinical Note  Hailey Stanton is a 58 y.o. 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 She has constant pain, with activity and at rest and significant functional deficits with difficulties even with ADLs. She has had extensive non-op management including analgesics, injections of cortisone and viscosupplements, and home exercise program, but remains in significant pain with significant dysfunction.Radiographs show bone on bone arthritis medial and patellofemoral. She presents now for right Total Knee Arthroplasty.    Procedure in detail---   The patient is brought into the operating room and positioned supine on the operating table. After successful administration of  Spinal,   a tourniquet is placed high on the  Right thigh(s) and the lower extremity is prepped and draped in the usual sterile fashion. Time out is performed by the operating team and then the  Right lower extremity is wrapped in Esmarch, knee flexed and the tourniquet inflated to 300 mmHg.       A midline incision is made with a ten blade through the subcutaneous tissue to the level of the extensor mechanism. A fresh blade is used to make a medial parapatellar arthrotomy. Soft tissue over the proximal medial tibia is subperiosteally elevated to the joint line with a knife and into the semimembranosus bursa with a Cobb elevator. Soft tissue over the proximal lateral tibia is elevated with  attention being paid to avoiding the patellar tendon on the tibial tubercle. The patella is everted, knee flexed 90 degrees and the ACL and PCL are removed. Findings are bone on bone medial and patellofemoral with large medial osteophytes.        The drill is used to create a starting hole in the distal femur and the canal is thoroughly irrigated with sterile saline to remove the fatty contents. The 5 degree Right  valgus alignment guide is placed into the femoral canal and the distal femoral cutting block is pinned to remove 9 mm off the distal femur. Resection is made with an oscillating saw.      The tibia is subluxed forward and the menisci are removed. The extramedullary alignment guide is placed referencing proximally at the medial aspect of the tibial tubercle and distally along the second metatarsal axis and tibial crest. The block is pinned to remove 2mm off the more deficient medial  side. Resection is made with an oscillating saw. Size 5is the most appropriate size for the tibia and the proximal tibia is prepared with the modular drill and keel punch for that size.      The femoral sizing guide is placed and size 5 is most appropriate. Rotation is marked off the epicondylar axis and confirmed by creating a rectangular flexion gap at 90 degrees. The size 5 cutting block is pinned in this rotation and the anterior, posterior and chamfer cuts are made with the oscillating saw. The intercondylar block is then placed and that cut is made.      Trial size 5 tibial component, trial size 5 posterior  stabilized femur and a 6  mm posterior stabilized rotating platform insert trial is placed. Full extension is achieved with excellent varus/valgus and anterior/posterior balance throughout full range of motion. The patella is everted and thickness measured to be 21  mm. Free hand resection is taken to 12 mm, a 35 template is placed, lug holes are drilled, trial patella is placed, and it tracks normally.  Osteophytes are removed off the posterior femur with the trial in place. All trials are removed and the cut bone surfaces prepared with pulsatile lavage. Cement is mixed and once ready for implantation, the size 5 tibial implant, size  5 posterior stabilized femoral component, and the size 35 patella are cemented in place and the patella is held with the clamp. The trial insert is placed and the knee held in full extension. The Exparel (20 ml mixed with 30 ml saline) and .25% Bupivicaine, are injected into the extensor mechanism, posterior capsule, medial and lateral gutters and subcutaneous tissues.  All extruded cement is removed and once the cement is hard the permanent 6 mm posterior stabilized rotating platform insert is placed into the tibial tray.      The wound is copiously irrigated with saline solution and the extensor mechanism closed over a hemovac drain with #1 PDS suture. The tourniquet is released for a total tourniquet time of 42  minutes. Flexion against gravity is 140 degrees and the patella tracks normally. Subcutaneous tissue is closed with 2.0 vicryl and subcuticular with running 4.0 Monocryl. The incision is cleaned and dried and steri-strips and a bulky sterile dressing are applied. The limb is placed into a knee immobilizer and the patient is awakened and transported to recovery in stable condition.      Please note that a surgical assistant was a medical necessity for this procedure in order to perform it in a safe and expeditious manner. Surgical assistant was necessary to retract the ligaments and vital neurovascular structures to prevent injury to them and also necessary for proper positioning of the limb to allow for anatomic placement of the prosthesis.   Gus RankinFrank V. Yuta Cipollone, MD    06/11/2013, 10:38 AM

## 2013-06-12 LAB — BASIC METABOLIC PANEL
BUN: 13 mg/dL (ref 6–23)
CO2: 22 mEq/L (ref 19–32)
Calcium: 8.3 mg/dL — ABNORMAL LOW (ref 8.4–10.5)
Chloride: 101 mEq/L (ref 96–112)
Creatinine, Ser: 0.61 mg/dL (ref 0.50–1.10)
GFR calc Af Amer: >90 mL/min (ref >90)
GFR calc non Af Amer: >90 mL/min (ref >90)
Glucose, Bld: 189 mg/dL — ABNORMAL HIGH (ref 70–99)
Potassium: 4.2 mEq/L (ref 3.7–5.3)
Sodium: 138 mEq/L (ref 137–147)

## 2013-06-12 LAB — CBC
HCT: 34.1 % — ABNORMAL LOW (ref 36.0–46.0)
Hemoglobin: 10.8 g/dL — ABNORMAL LOW (ref 12.0–15.0)
MCH: 28.3 pg (ref 26.0–34.0)
MCHC: 31.7 g/dL (ref 30.0–36.0)
MCV: 89.3 fL (ref 78.0–100.0)
Platelets: 297 10*3/uL (ref 150–400)
RBC: 3.82 MIL/uL — ABNORMAL LOW (ref 3.87–5.11)
RDW: 13.2 % (ref 11.5–15.5)
WBC: 16.6 10*3/uL — ABNORMAL HIGH (ref 4.0–10.5)

## 2013-06-12 MED ORDER — TRAMADOL HCL 50 MG PO TABS: 50.0000 mg | ORAL_TABLET | Freq: Four times a day (QID) | ORAL | Status: DC | PRN

## 2013-06-12 MED ORDER — METHOCARBAMOL 500 MG PO TABS: 500.0000 mg | ORAL_TABLET | Freq: Four times a day (QID) | ORAL | Status: DC | PRN

## 2013-06-12 MED ORDER — OXYCODONE HCL 5 MG PO TABS: 5.0000 mg | ORAL_TABLET | ORAL | Status: DC | PRN

## 2013-06-12 MED ORDER — RIVAROXABAN 10 MG PO TABS: 10.0000 mg | ORAL_TABLET | Freq: Every day | ORAL | Status: DC
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
## 2013-06-12 NOTE — Progress Notes (Signed)
Physical Therapy Treatment Patient Details Name: Hailey Stanton MRN: [id number] DOB: 07/15/1955 Today's Date: 06/12/2013 Time: [id number] PT Time Calculation (min): 29 min  PT Assessment / Plan / Recommendation  History of Present Illness     PT Comments     Follow Up Recommendations  Home health PT     Does the patient have the potential to tolerate intense rehabilitation     Barriers to Discharge        Equipment Recommendations  Rolling walker with 5" wheels    Recommendations for Other Services OT consult  Frequency 7X/week   Progress towards PT Goals Progress towards PT goals: Progressing toward goals  Plan Current plan remains appropriate    Precautions / Restrictions Precautions Precautions: Fall;Knee Required Braces or Orthoses: Knee Immobilizer - Right Knee Immobilizer - Right: Discontinue once straight leg raise with < 10 degree lag Restrictions Weight Bearing Restrictions: No Other Position/Activity Restrictions: WBAT   Pertinent Vitals/Pain 6/10; premed, ice packs provided    Mobility  Bed Mobility Overal bed mobility: Needs Assistance Bed Mobility: Sit to Supine Sit to supine: Min assist General bed mobility comments: cues for sequence and use of L LE to self assist - physical assist to manage R LE Transfers Overall transfer level: Needs assistance Equipment used: Rolling walker (2 wheeled) Transfers: Sit to/from Stand Sit to Stand: Min assist General transfer comment: cues for LE management and use of UEs to self assist Ambulation/Gait Ambulation/Gait assistance: Min assist Ambulation Distance (Feet): 74 Feet (and 20) Assistive device: Rolling walker (2 wheeled) Gait Pattern/deviations: Step-to pattern;Decreased step length - right;Decreased step length - left;Shuffle;Trunk flexed General Gait Details: cues for sequence, posture and position from RW    Exercises     PT Diagnosis:    PT Problem List:   PT Treatment Interventions:     PT Goals  (current goals can now be found in the care plan section) Acute Rehab PT Goals Patient Stated Goal: Resume previous lifestyle with decreaed pain PT Goal Formulation: With patient Time For Goal Achievement: 06/18/13 Potential to Achieve Goals: Good  Visit Information  Last PT Received On: 06/12/13 Assistance Needed: +1    Subjective Data  Subjective: I need to go to the bathroom Patient Stated Goal: Resume previous lifestyle with decreaed pain   Cognition  Cognition Arousal/Alertness: Awake/alert Behavior During Therapy: WFL for tasks assessed/performed Overall Cognitive Status: Within Functional Limits for tasks assessed    Balance     End of Session PT - End of Session Equipment Utilized During Treatment: Gait belt;Right knee immobilizer Activity Tolerance: Patient tolerated treatment well Patient left: in bed;with call bell/phone within reach;with family/visitor present Nurse Communication: Mobility status CPM Right Knee CPM Right Knee: Off   GP     Hailey Stanton 06/12/2013, 4:51 PM

## 2013-06-12 NOTE — Evaluation (Signed)
Physical Therapy Evaluation Patient Details Name: Hailey Stanton MRN: [id number] DOB: 04/24/1956 Today's Date: 06/12/2013 Time: [id number] PT Time Calculation (min): 36 min  PT Assessment / Plan / Recommendation History of Present Illness     Clinical Impression  Pt s/p R TKR presents with decreased R LE strength/ROM, post op pain and obesity limiting functional mobility.  Pt is motivated and should progress to d/c home with family assist and HHPT follow up.    PT Assessment  Patient needs continued PT services    Follow Up Recommendations  Home health PT    Does the patient have the potential to tolerate intense rehabilitation      Barriers to Discharge        Equipment Recommendations  Rolling walker with 5" wheels (WIDE)    Recommendations for Other Services OT consult   Frequency 7X/week    Precautions / Restrictions Precautions Precautions: Fall;Knee Required Braces or Orthoses: Knee Immobilizer - Right Knee Immobilizer - Right: Discontinue once straight leg raise with < 10 degree lag Restrictions Weight Bearing Restrictions: No Other Position/Activity Restrictions: WBAT (WBAT)   Pertinent Vitals/Pain 6/10, premed, ice packs provided      Mobility  Bed Mobility Overal bed mobility: Needs Assistance Bed Mobility: Supine to Sit Supine to sit: Mod assist General bed mobility comments: cues for sequence and assist for R LE management Transfers Overall transfer level: Needs assistance Equipment used: Rolling walker (2 wheeled) Transfers: Sit to/from Stand Sit to Stand: Mod assist General transfer comment: cues for LE management and use of UEs to self assist Ambulation/Gait Ambulation/Gait assistance: Min assist;Mod assist Ambulation Distance (Feet): 61 Feet Assistive device: Rolling walker (2 wheeled) Gait Pattern/deviations: Step-to pattern;Decreased step length - right;Decreased step length - left;Shuffle;Trunk flexed General Gait Details: cues for  sequence, posture and position from RW    Exercises Total Joint Exercises Ankle Circles/Pumps: AROM;Both;10 reps;Supine Quad Sets: AROM;Both;10 reps;Supine Heel Slides: AAROM;Right;Supine;10 reps Straight Leg Raises: AAROM;Right;10 reps;Supine   PT Diagnosis: Difficulty walking  PT Problem List: Decreased strength;Decreased range of motion;Decreased activity tolerance;Decreased mobility;Decreased knowledge of use of DME;Obesity;Pain PT Treatment Interventions: DME instruction;Gait training;Stair training;Functional mobility training;Therapeutic activities;Therapeutic exercise;Patient/family education     PT Goals(Current goals can be found in the care plan section) Acute Rehab PT Goals Patient Stated Goal: Resume previous lifestyle with decreaed pain PT Goal Formulation: With patient Time For Goal Achievement: 06/18/13 Potential to Achieve Goals: Good  Visit Information  Last PT Received On: 06/12/13 Assistance Needed: +1       Prior Functioning  Home Living Family/patient expects to be discharged to:: Private residence Living Arrangements: Spouse/significant other Available Help at Discharge: Family Type of Home: Mobile home Home Access: Stairs to enter Secretary/administratorntrance Stairs-Number of Steps: 4 Entrance Stairs-Rails: Left;Right Home Layout: One level Home Equipment: Environmental consultantWalker - 2 wheels Additional Comments: RW is likely too narrow for pt Prior Function Level of Independence: Independent Communication Communication: No difficulties Dominant Hand: Right    Cognition  Cognition Arousal/Alertness: Awake/alert Behavior During Therapy: WFL for tasks assessed/performed Overall Cognitive Status: Within Functional Limits for tasks assessed    Extremity/Trunk Assessment Upper Extremity Assessment Upper Extremity Assessment: Overall WFL for tasks assessed Lower Extremity Assessment Lower Extremity Assessment: RLE deficits/detail RLE Deficits / Details: quads 2+/5 with AAROM at knee  -10 - 40 Cervical / Trunk Assessment Cervical / Trunk Assessment: Normal   Balance    End of Session PT - End of Session Equipment Utilized During Treatment: Gait belt;Right knee immobilizer Activity Tolerance: Patient  tolerated treatment well Patient left: in chair;with call bell/phone within reach;with family/visitor present Nurse Communication: Mobility status  GP     Ferd Horrigan 06/12/2013, 12:52 PM

## 2013-06-13 LAB — BASIC METABOLIC PANEL
BUN: 14 mg/dL (ref 6–23)
CO2: 28 mEq/L (ref 19–32)
Calcium: 8.4 mg/dL (ref 8.4–10.5)
Chloride: 98 mEq/L (ref 96–112)
Creatinine, Ser: 0.62 mg/dL (ref 0.50–1.10)
GFR calc Af Amer: >90 mL/min (ref >90)
GFR calc non Af Amer: >90 mL/min (ref >90)
Glucose, Bld: 153 mg/dL — ABNORMAL HIGH (ref 70–99)
Potassium: 4.1 mEq/L (ref 3.7–5.3)
Sodium: 137 mEq/L (ref 137–147)

## 2013-06-13 LAB — CBC
HCT: 31.6 % — ABNORMAL LOW (ref 36.0–46.0)
Hemoglobin: 10.2 g/dL — ABNORMAL LOW (ref 12.0–15.0)
MCH: 28.7 pg (ref 26.0–34.0)
MCHC: 32.3 g/dL (ref 30.0–36.0)
MCV: 88.8 fL (ref 78.0–100.0)
Platelets: 271 10*3/uL (ref 150–400)
RBC: 3.56 MIL/uL — ABNORMAL LOW (ref 3.87–5.11)
RDW: 13.4 % (ref 11.5–15.5)
WBC: 17.8 10*3/uL — ABNORMAL HIGH (ref 4.0–10.5)

## 2013-06-13 NOTE — Evaluation (Signed)
Occupational Therapy Evaluation Patient Details Name: Hailey Stanton MRN: [id number] DOB: 10/13/1955 Today's Date: 06/13/2013 Time: [id number] OT Time Calculation (min): 25 min  OT Assessment / Plan / Recommendation History of present illness Pt s/p R TKR presents with decreased I with ADL activity  post op pain and obesity limiting functional mobility.  Pt is motivated and should progress to d/c home with family assist and HHOT follow up.   Clinical Impression   Pt presents to OT with decreased I with ADL activity and will benefit from skilled OT to increase I with ADL activity prior to DC home      Follow Up Recommendations  Home health OT       Equipment Recommendations  3 in 1 bedside comode (will likely need wide 3 n 1)          Precautions / Restrictions Precautions Precautions: Fall;Knee Required Braces or Orthoses: Knee Immobilizer - Right Knee Immobilizer - Right: Discontinue once straight leg raise with < 10 degree lag Restrictions Weight Bearing Restrictions: No Other Position/Activity Restrictions: WBAT       ADL  Grooming: Set up Where Assessed - Grooming: Unsupported sitting Upper Body Bathing: Set up Where Assessed - Upper Body Bathing: Unsupported sitting Lower Body Bathing: Moderate assistance Where Assessed - Lower Body Bathing: Unsupported sit to stand Upper Body Dressing: Set up Where Assessed - Upper Body Dressing: Unsupported sitting Lower Body Dressing: Moderate assistance Where Assessed - Lower Body Dressing: Unsupported sit to stand Toilet Transfer Method: Sit to stand Toileting - Clothing Manipulation and Hygiene: Minimal assistance Where Assessed - Toileting Clothing Manipulation and Hygiene: Standing ADL Comments: Pt will benefit from AE to increase I with dressing.  Wil show pt  AE next OT visit    OT Diagnosis:    OT Problem List:   OT Treatment Interventions:     OT Goals(Current goals can be found in the care plan section) Acute Rehab  OT Goals Patient Stated Goal: Resume previous lifestyle with decreased OT Goal Formulation: With patient Time For Goal Achievement: 06/27/13 Potential to Achieve Goals: Good ADL Goals Pt Will Perform Lower Body Bathing: with supervision;sit to/from stand Pt Will Perform Lower Body Dressing: with supervision;sit to/from stand Pt Will Transfer to Toilet: with supervision;ambulating;bedside commode Pt Will Perform Toileting - Clothing Manipulation and hygiene: with supervision;sit to/from stand  Visit Information  History of Present Illness: Pt s/p R TKR presents with decreased I with ADL activity  post op pain and obesity limiting functional mobility.  Pt is motivated and should progress to d/c home with family assist and HHOT follow up.       Prior Functioning     Home Living Family/patient expects to be discharged to:: Private residence Living Arrangements: Spouse/significant other Available Help at Discharge: Family Type of Home: Mobile home Home Access: Stairs to enter Secretary/administratorntrance Stairs-Number of Steps: 4 Entrance Stairs-Rails: Left;Right Home Layout: One level Home Equipment: Environmental consultantWalker - 2 wheels Additional Comments: RW is likely too narrow for pt Prior Function Level of Independence: Independent Communication Communication: No difficulties Dominant Hand: Right         Vision/Perception Vision - History Patient Visual Report: No change from baseline   Cognition  Cognition Arousal/Alertness: Awake/alert Behavior During Therapy: WFL for tasks assessed/performed Overall Cognitive Status: Within Functional Limits for tasks assessed    Extremity/Trunk Assessment Lower Extremity Assessment RLE Deficits / Details: quads 2+/5 with AAROM at knee -10 - 40 Cervical / Trunk Assessment Cervical / Trunk Assessment: Normal  Mobility Bed Mobility Overal bed mobility: Needs Assistance Bed Mobility: Sit to Supine Supine to sit: Min guard Transfers Overall transfer level:  Needs assistance Equipment used: Rolling walker (2 wheeled) Transfers: Sit to/from [employer] Sit to Stand: Supervision Stand pivot transfers: Supervision     Exercise     Balance     End of Session OT - End of Session Activity Tolerance: Patient tolerated treatment well Patient left: in chair;with call bell/phone within reach;with family/visitor present Nurse Communication: Mobility status CPM Right Knee CPM Right Knee: Off  GO     Alba Cory 06/13/2013, 11:21 AM

## 2013-06-13 NOTE — Progress Notes (Signed)
Subjective: 2 Days Post-Op Procedure(s) (LRB): RIGHT TOTAL KNEE ARTHROPLASTY (Right) Patient reports pain as 6 on 0-10 scale.  Seems fairly comfortable.   Objective: Vital signs in last 24 hours: Temp:  [97.7 F (36.5 C)-99 F (37.2 C)] 99 F (37.2 C) (01/11 0500) Pulse Rate:  [73-93] 80 (01/11 0500) Resp:  [14-16] 16 (01/11 0500) BP: (111-173)/(66-87) 152/77 mmHg (01/11 0500) SpO2:  [93 %-95 %] 95 % (01/11 0500)  Intake/Output from previous day: 01/10 0701 - 01/11 0700 In: 2510 [P.O.:600; I.V.:1910] Out: 2450 [Urine:2450] Intake/Output this shift:     Recent Labs  06/12/13 0535 06/13/13 0524  HGB 10.8* 10.2*    Recent Labs  06/12/13 0535 06/13/13 0524  WBC 16.6* 17.8*  RBC 3.82* 3.56*  HCT 34.1* 31.6*  PLT 297 271    Recent Labs  06/12/13 0535 06/13/13 0524  NA 138 137  K 4.2 4.1  CL 101 98  CO2 22 28  BUN 13 14  CREATININE 0.61 0.62  GLUCOSE 189* 153*  CALCIUM 8.3* 8.4   No results found for this basename: LABPT, INR,  in the last 72 hours  Incision: no drainage wound looks good calf neg  Assessment/Plan: 2 Days Post-Op Procedure(s) (LRB): RIGHT TOTAL KNEE ARTHROPLASTY (Right) Up with therapy States not ready for home  Continue current Tx    D/C in am  Talked with patient and husband  Hailey Stanton ANDREW 06/13/2013, 9:23 AM

## 2013-06-13 NOTE — Progress Notes (Signed)
Physical Therapy Treatment Patient Details Name: Hailey Stanton MRN: [id number] DOB: 04/02/1956 Today's Date: 06/13/2013 Time: [id number] PT Time Calculation (min): 25 min  PT Assessment / Plan / Recommendation  History of Present Illness Pt s/p R TKR presents with decreased I with ADL activity  post op pain and obesity limiting functional mobility.  Pt is motivated and should progress to d/c home with family assist and HHOT follow up.   PT Comments     Follow Up Recommendations  Home health PT     Does the patient have the potential to tolerate intense rehabilitation     Barriers to Discharge        Equipment Recommendations  Rolling walker with 5" wheels    Recommendations for Other Services OT consult  Frequency 7X/week   Progress towards PT Goals Progress towards PT goals: Progressing toward goals  Plan Current plan remains appropriate    Precautions / Restrictions Precautions Precautions: Fall;Knee Required Braces or Orthoses: Knee Immobilizer - Right Knee Immobilizer - Right: Discontinue once straight leg raise with < 10 degree lag Restrictions Weight Bearing Restrictions: No Other Position/Activity Restrictions: WBAT   Pertinent Vitals/Pain 8/10; premed, ice packs provided    Mobility  Bed Mobility Overal bed mobility: Needs Assistance Bed Mobility: Sit to Supine Supine to sit: Min guard Transfers Overall transfer level: Needs assistance Equipment used: Rolling walker (2 wheeled) Transfers: Sit to/from Stand Sit to Stand: Min guard Stand pivot transfers: Supervision General transfer comment: cues for LE management and use of UEs to self assist Ambulation/Gait Ambulation/Gait assistance: Min assist;Min guard Ambulation Distance (Feet): 123 Feet Assistive device: Rolling walker (2 wheeled) Gait Pattern/deviations: Step-to pattern;Decreased step length - right;Decreased step length - left;Trunk flexed;Shuffle General Gait Details: cues for sequence, posture  and position from RW    Exercises Total Joint Exercises Ankle Circles/Pumps: AROM;Both;Supine;20 reps Quad Sets: AROM;Both;Supine;20 reps Heel Slides: AAROM;Right;Supine;20 reps Straight Leg Raises: AAROM;Right;Supine;20 reps Goniometric ROM: AAROM at knee -8 - 40   PT Diagnosis:    PT Problem List:   PT Treatment Interventions:     PT Goals (current goals can now be found in the care plan section) Acute Rehab PT Goals Patient Stated Goal: Resume previous lifestyle with decreased PT Goal Formulation: With patient Time For Goal Achievement: 06/18/13 Potential to Achieve Goals: Good  Visit Information  Last PT Received On: 06/13/13 Assistance Needed: +1 History of Present Illness: Pt s/p R TKR presents with decreased I with ADL activity  post op pain and obesity limiting functional mobility.  Pt is motivated and should progress to d/c home with family assist and HHOT follow up.    Subjective Data  Patient Stated Goal: Resume previous lifestyle with decreased   Cognition  Cognition Arousal/Alertness: Awake/alert Behavior During Therapy: WFL for tasks assessed/performed Overall Cognitive Status: Within Functional Limits for tasks assessed    Balance     End of Session PT - End of Session Equipment Utilized During Treatment: Gait belt;Right knee immobilizer Activity Tolerance: Patient tolerated treatment well;Patient limited by pain Patient left: in chair;with call bell/phone within reach;with family/visitor present Nurse Communication: Mobility status CPM Right Knee CPM Right Knee: Off   GP     Hailey Stanton 06/13/2013, 1:42 PM

## 2013-06-13 NOTE — Progress Notes (Signed)
Physical Therapy Treatment Patient Details Name: Hailey SeverinBrenda K Filyaw MRN: [id number] DOB: 01/11/1956 Today's Date: 06/13/2013 Time: [id number] PT Time Calculation (min): 20 min  PT Assessment / Plan / Recommendation  History of Present Illness Pt s/p R TKR presents with decreased I with ADL activity  post op pain and obesity limiting functional mobility.  Pt is motivated and should progress to d/c home with family assist and HHOT follow up.   PT Comments     Follow Up Recommendations  Home health PT     Does the patient have the potential to tolerate intense rehabilitation     Barriers to Discharge        Equipment Recommendations  Rolling walker with 5" wheels    Recommendations for Other Services OT consult  Frequency 7X/week   Progress towards PT Goals Progress towards PT goals: Progressing toward goals  Plan Current plan remains appropriate    Precautions / Restrictions Precautions Precautions: Fall;Knee Required Braces or Orthoses: Knee Immobilizer - Right Knee Immobilizer - Right: Discontinue once straight leg raise with < 10 degree lag Restrictions Weight Bearing Restrictions: No Other Position/Activity Restrictions: WBAT   Pertinent Vitals/Pain 8/10; meds received during session.    Mobility  Bed Mobility Overal bed mobility: Needs Assistance Bed Mobility: Sit to Supine Supine to sit: Min guard Transfers Overall transfer level: Needs assistance Equipment used: Rolling walker (2 wheeled) Transfers: Sit to/from Stand Sit to Stand: Min guard Stand pivot transfers: Supervision General transfer comment: cues for LE management and use of UEs to self assist Ambulation/Gait Ambulation/Gait assistance: Min assist;Min guard Ambulation Distance (Feet): 123 Feet Assistive device: Rolling walker (2 wheeled) Gait Pattern/deviations: Step-to pattern;Decreased step length - right;Decreased step length - left;Trunk flexed;Shuffle General Gait Details: cues for sequence,  posture and position from RW    Exercises     PT Diagnosis:    PT Problem List:   PT Treatment Interventions:     PT Goals (current goals can now be found in the care plan section) Acute Rehab PT Goals Patient Stated Goal: Resume previous lifestyle with decreased PT Goal Formulation: With patient Time For Goal Achievement: 06/18/13 Potential to Achieve Goals: Good  Visit Information  Last PT Received On: 06/13/13 Assistance Needed: +1 History of Present Illness: Pt s/p R TKR presents with decreased I with ADL activity  post op pain and obesity limiting functional mobility.  Pt is motivated and should progress to d/c home with family assist and HHOT follow up.    Subjective Data  Patient Stated Goal: Resume previous lifestyle with decreased   Cognition  Cognition Arousal/Alertness: Awake/alert Behavior During Therapy: WFL for tasks assessed/performed Overall Cognitive Status: Within Functional Limits for tasks assessed    Balance     End of Session PT - End of Session Equipment Utilized During Treatment: Gait belt;Right knee immobilizer Activity Tolerance: Patient tolerated treatment well;Patient limited by pain Patient left: in chair;with call bell/phone within reach;with family/visitor present Nurse Communication: Mobility status CPM Right Knee CPM Right Knee: Off   GP     Eloyce Bultman 06/13/2013, 1:40 PM

## 2013-06-13 NOTE — Plan of Care (Signed)
Problem: Phase III Progression Outcomes Goal: Anticoagulant follow-up in place Outcome: Not Applicable Date Met:  82/60/88 xarelto

## 2013-06-14 ENCOUNTER — Encounter (HOSPITAL_COMMUNITY): Payer: Self-pay | Admitting: Orthopedic Surgery

## 2013-06-14 LAB — CBC
HCT: 29.4 % — ABNORMAL LOW (ref 36.0–46.0)
Hemoglobin: 9.4 g/dL — ABNORMAL LOW (ref 12.0–15.0)
MCH: 28.4 pg (ref 26.0–34.0)
MCHC: 32 g/dL (ref 30.0–36.0)
MCV: 88.8 fL (ref 78.0–100.0)
Platelets: 278 10*3/uL (ref 150–400)
RBC: 3.31 MIL/uL — ABNORMAL LOW (ref 3.87–5.11)
RDW: 13.8 % (ref 11.5–15.5)
WBC: 12.7 10*3/uL — ABNORMAL HIGH (ref 4.0–10.5)

## 2013-06-14 MED ORDER — METHOCARBAMOL 500 MG PO TABS: 500.0000 mg | ORAL_TABLET | Freq: Four times a day (QID) | ORAL | Status: DC | PRN

## 2013-06-14 MED ORDER — TRAMADOL HCL 50 MG PO TABS: 50.0000 mg | ORAL_TABLET | Freq: Four times a day (QID) | ORAL | Status: DC | PRN

## 2013-06-14 MED ORDER — OXYCODONE HCL 5 MG PO TABS: 5.0000 mg | ORAL_TABLET | ORAL | Status: DC | PRN

## 2013-06-14 MED ORDER — RIVAROXABAN 10 MG PO TABS: 10.0000 mg | ORAL_TABLET | Freq: Every day | ORAL | Status: DC

## 2013-06-14 NOTE — Discharge Instructions (Signed)
° °Dr. Frank Aluisio °Total Joint Specialist °[facility] °[address] °[place], [place] [address] °[phone] ° °TOTAL KNEE REPLACEMENT POSTOPERATIVE DIRECTIONS ° ° ° °Knee Rehabilitation, Guidelines Following Surgery  °Results after knee surgery are often greatly improved when you follow the exercise, range of motion and muscle strengthening exercises prescribed by your doctor. Safety measures are also important to protect the knee from further injury. Any time any of these exercises cause you to have increased pain or swelling in your knee joint, decrease the amount until you are comfortable again and slowly increase them. If you have problems or questions, call your caregiver or physical therapist for advice.  ° °HOME CARE INSTRUCTIONS  °Remove items at home which could result in a fall. This includes throw rugs or furniture in walking pathways.  °Continue medications as instructed at time of discharge. °You may have some home medications which will be placed on hold until you complete the course of blood thinner medication.  °You may start showering once you are discharged home but do not submerge the incision under water. Just pat the incision dry and apply a dry gauze dressing on daily. °Walk with walker as instructed.  °You may resume a sexual relationship in one month or when given the OK by  your doctor.  °· Use walker as long as suggested by your caregivers. °· Avoid periods of inactivity such as sitting longer than an hour when not asleep. This helps prevent blood clots.  °You may put full weight on your legs and walk as much as is comfortable.  °You may return to work once you are cleared by your doctor.  °Do not drive a car for 6 weeks or until released by you surgeon.  °· Do not drive while taking narcotics.  °Wear the elastic stockings for three weeks following surgery during the day but you may remove then at night. °Make sure you keep all of your appointments after your  operation with all of your doctors and caregivers. You should call the office at the above phone number and make an appointment for approximately two weeks after the date of your surgery. °Change the dressing daily and reapply a dry dressing each time. °Please pick up a stool softener and laxative for home use as long as you are requiring pain medications. °· Continue to use ice on the knee for pain and swelling from surgery. You may notice swelling that will progress down to the foot and ankle.  This is normal after surgery.  Elevate the leg when you are not up walking on it.   °It is important for you to complete the blood thinner medication as prescribed by your doctor. °· Continue to use the breathing machine which will help keep your temperature down.  It is common for your temperature to cycle up and down following surgery, especially at night when you are not up moving around and exerting yourself.  The breathing machine keeps your lungs expanded and your temperature down. ° °RANGE OF MOTION AND STRENGTHENING EXERCISES  °Rehabilitation of the knee is important following a knee injury or an operation. After just a few days of immobilization, the muscles of the thigh which control the knee become weakened and shrink (atrophy). Knee exercises are designed to build up the tone and strength of the thigh muscles and to improve knee motion. Often times heat used for twenty to thirty minutes before working out will loosen up your tissues and help with improving the   range of motion but do not use heat for the first two weeks following surgery. These exercises can be done on a training (exercise) mat, on the floor, on a table or on a bed. Use what ever works the best and is most comfortable for you Knee exercises include:  °Leg Lifts - While your knee is still immobilized in a splint or cast, you can do straight leg raises. Lift the leg to 60 degrees, hold for 3 sec, and slowly lower the leg. Repeat 10-20 times 2-3  times daily. Perform this exercise against resistance later as your knee gets better.  °Quad and Hamstring Sets - Tighten up the muscle on the front of the thigh (Quad) and hold for 5-10 sec. Repeat this 10-20 times hourly. Hamstring sets are done by pushing the foot backward against an object and holding for 5-10 sec. Repeat as with quad sets.  °A rehabilitation program following serious knee injuries can speed recovery and prevent re-injury in the future due to weakened muscles. Contact your doctor or a physical therapist for more information on knee rehabilitation.  ° °SKILLED REHAB INSTRUCTIONS: °If the patient is transferred to a skilled rehab facility following release from the hospital, a list of the current medications will be sent to the facility for the patient to continue.  When discharged from the skilled rehab facility, please have the facility set up the patient's Home Health Physical Therapy prior to being released. Also, the skilled facility will be responsible for providing the patient with their medications at time of release from the facility to include their pain medication, the muscle relaxants, and their blood thinner medication. If the patient is still at the rehab facility at time of the two week follow up appointment, the skilled rehab facility will also need to assist the patient in arranging follow up appointment in our office and any transportation needs. ° °MAKE SURE YOU:  °Understand these instructions.  °Will watch your condition.  °Will get help right away if you are not doing well or get worse.  ° ° °Pick up stool softner and laxative for home. °Do not submerge incision under water. °May shower. °Continue to use ice for pain and swelling from surgery. ° °Take Xarelto for two and a half more weeks, then discontinue Xarelto. °Once the patient has completed the blood thinner regimen, then take a Baby 81 mg Aspirin daily for four more weeks. ° ° ° ° ° ° ° ° ° °
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
## 2013-06-14 NOTE — Progress Notes (Signed)
Occupational Therapy Treatment Patient Details Name: Hailey Stanton MRN: [id number] DOB: 03/05/1956 Today's Date: 06/14/2013 Time: [id number] OT Time Calculation (min): 16 min  OT Assessment / Plan / Recommendation  History of present illness Pt s/p R TKR presents with decreased I with ADL activity  post op pain and obesity limiting functional mobility.  Pt is motivated and should progress to d/c home with family assist and HHOT follow up.   OT comments  Ready to go home from OT standpoint.  Husband will A with ADL activity as needed.  Education complete                 Progress towards OT Goals Progress towards OT goals: Progressing toward goals  Plan Discharge plan remains appropriate    Precautions / Restrictions Precautions Precautions: Fall;Knee Required Braces or Orthoses: Knee Immobilizer - Right Knee Immobilizer - Right: Discontinue once straight leg raise with < 10 degree lag Restrictions Weight Bearing Restrictions: No Other Position/Activity Restrictions: WBAT       ADL  Lower Body Dressing: Moderate assistance Where Assessed - Lower Body Dressing: Unsupported sit to stand Toilet Transfer: Min Pension scheme managerguard Toilet Transfer Method: Sit to [place] Toilet Transfer Equipment: Comfort height toilet Toileting - Clothing Manipulation and Hygiene: Min guard Where Assessed - Toileting Clothing Manipulation and Hygiene: Standing ADL Comments: Husband will purchase sock aide for pt. Pt has reacher to A with LB dressing      OT Goals(current goals can now be found in the care plan section)    Visit Information  Last OT Received On: 06/14/13 Assistance Needed: +1 History of Present Illness: Pt s/p R TKR presents with decreased I with ADL activity  post op pain and obesity limiting functional mobility.  Pt is motivated and should progress to d/c home with family assist and HHOT follow up.          Cognition  Cognition Arousal/Alertness: Awake/alert Behavior During Therapy: WFL  for tasks assessed/performed Overall Cognitive Status: Within Functional Limits for tasks assessed    Mobility  Bed Mobility Overal bed mobility: Needs Assistance Bed Mobility: Supine to Sit Supine to sit: Min assist Transfers Overall transfer level: Needs assistance Equipment used: Rolling walker (2 wheeled) Transfers: Sit to/from Stand Sit to Stand: Supervision Stand pivot transfers: Supervision          End of Session OT - End of Session Activity Tolerance: Patient tolerated treatment well Patient left: in bed;with family/visitor present;with call bell/phone within reach Nurse Communication: Mobility status  GO     Alba CoryREDDING, Tamiah Dysart D 06/14/2013, 9:25 AM

## 2013-06-14 NOTE — Progress Notes (Signed)
Physical Therapy Treatment Patient Details Name: Hailey Stanton MRN: [id number] DOB: 06/07/1955 Today's Date: 06/14/2013 Time: [id number] PT Time Calculation (min): 53 min  PT Assessment / Plan / Recommendation  History of Present Illness Pt s/p R TKR presents with decreased I with ADL activity  post op pain and obesity limiting functional mobility.  Pt is motivated and should progress to d/c home with family assist and HHOT follow up.   PT Comments   Ready for d/c from PT perspective  Follow Up Recommendations  Home health PT     Does the patient have the potential to tolerate intense rehabilitation     Barriers to Discharge        Equipment Recommendations  Rolling walker with 5" wheels    Recommendations for Other Services OT consult  Frequency 7X/week   Progress towards PT Goals Progress towards PT goals: Progressing toward goals  Plan Current plan remains appropriate    Precautions / Restrictions Precautions Precautions: Fall;Knee Required Braces or Orthoses: Knee Immobilizer - Right Knee Immobilizer - Right: Discontinue once straight leg raise with < 10 degree lag Restrictions Weight Bearing Restrictions: No Other Position/Activity Restrictions: WBAT   Pertinent Vitals/Pain 7/10; premed, ice packs provided    Mobility  Bed Mobility Overal bed mobility: Needs Assistance Bed Mobility: Supine to Sit Supine to sit: Min assist General bed mobility comments: min assist with R LE - husband assisting Transfers Overall transfer level: Needs assistance Equipment used: Rolling walker (2 wheeled) Transfers: Sit to/from Stand Sit to Stand: Supervision Stand pivot transfers: Supervision General transfer comment: min cues for LE management  Ambulation/Gait Ambulation/Gait assistance: Min guard;Supervision Ambulation Distance (Feet): 130 Feet (and 50 and 20x 2) Assistive device: Rolling walker (2 wheeled) Gait Pattern/deviations: Step-to pattern;Shuffle;Trunk  flexed General Gait Details: cues for sequence, posture and position from RW Stairs: Yes Stairs assistance: Min assist Stair Management: One rail Right;Step to pattern;Forwards;With crutches Number of Stairs: 4 General stair comments: cues for sequence and foot/crutch placement    Exercises Total Joint Exercises Ankle Circles/Pumps: AROM;Both;Supine;20 reps Quad Sets: AROM;Both;Supine;20 reps Short Arc QuadBarbaraann Boys: AAROM;Both;10 reps;Supine Heel Slides: AAROM;Right;Supine;20 reps Straight Leg Raises: AAROM;Right;Supine;20 reps   PT Diagnosis:    PT Problem List:   PT Treatment Interventions:     PT Goals (current goals can now be found in the care plan section) Acute Rehab PT Goals Patient Stated Goal: Resume previous lifestyle with decreased PT Goal Formulation: With patient Time For Goal Achievement: 06/18/13 Potential to Achieve Goals: Good  Visit Information  Last PT Received On: 06/14/13 Assistance Needed: +1 History of Present Illness: Pt s/p R TKR presents with decreased I with ADL activity  post op pain and obesity limiting functional mobility.  Pt is motivated and should progress to d/c home with family assist and HHOT follow up.    Subjective Data  Subjective: I'm doing ok Patient Stated Goal: Resume previous lifestyle with decreased   Cognition  Cognition Arousal/Alertness: Awake/alert Behavior During Therapy: WFL for tasks assessed/performed Overall Cognitive Status: Within Functional Limits for tasks assessed    Balance     End of Session PT - End of Session Equipment Utilized During Treatment: Gait belt;Right knee immobilizer Activity Tolerance: Patient tolerated treatment well Patient left: in bed;with call bell/phone within reach;with family/visitor present Nurse Communication: Mobility status   GP     Hailey Stanton 06/14/2013, 9:49 AM
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
## 2013-06-17 DIAGNOSIS — D62 Acute posthemorrhagic anemia: Secondary | ICD-10-CM

## 2013-06-17 HISTORY — DX: Acute posthemorrhagic anemia: D62

## 2015-04-02 IMAGING — CR DG CHEST 2V
3 series · 3 of 3 positions shown · non-contrast
Comparison: 07/06/2007

CLINICAL DATA: Preoperative knee replacement

EXAM:
CHEST  2 VIEW

[w chest pa]
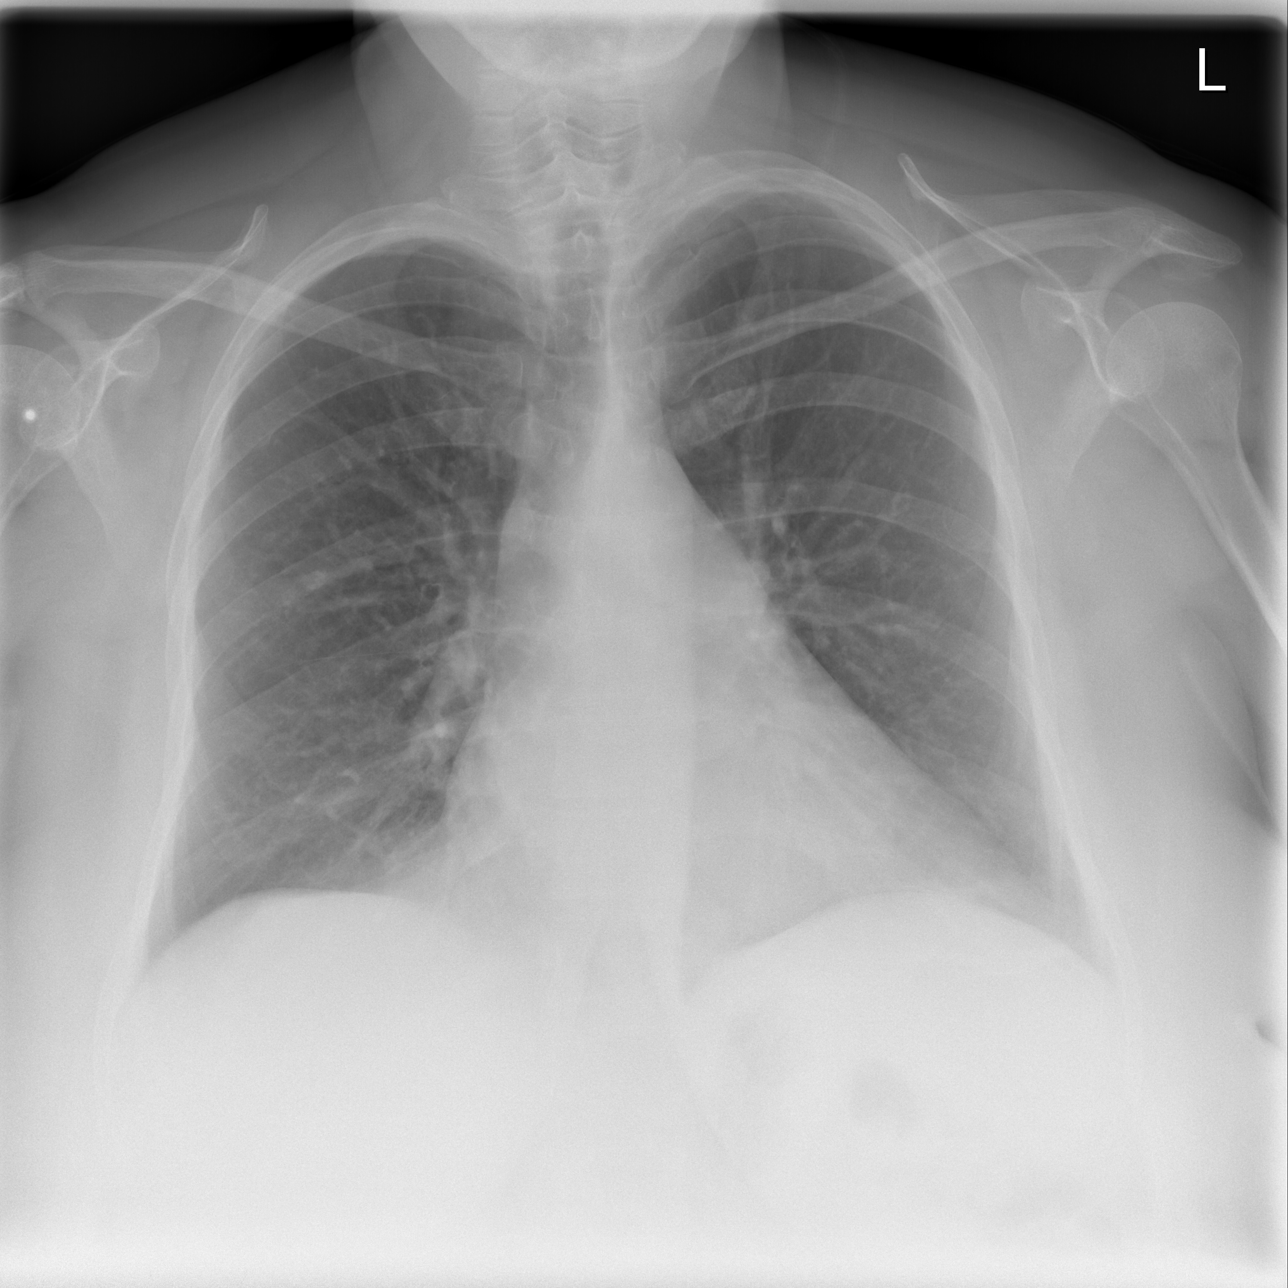

[w chest lat (1 of 2)]
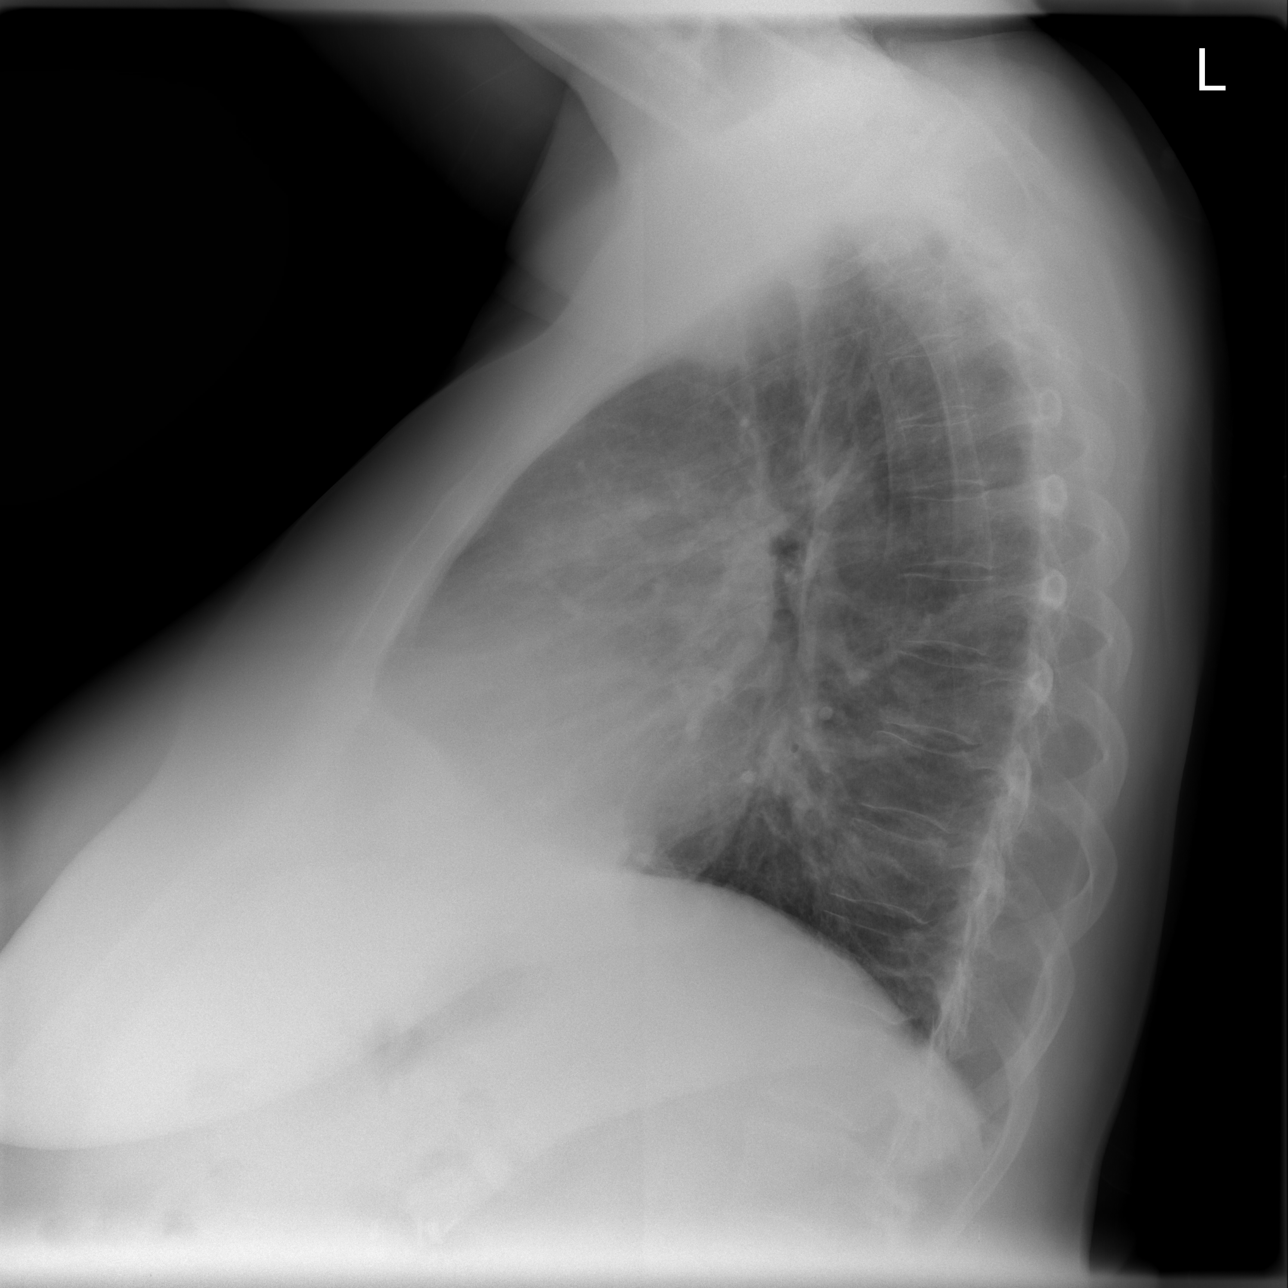

[w chest lat (2 of 2)]
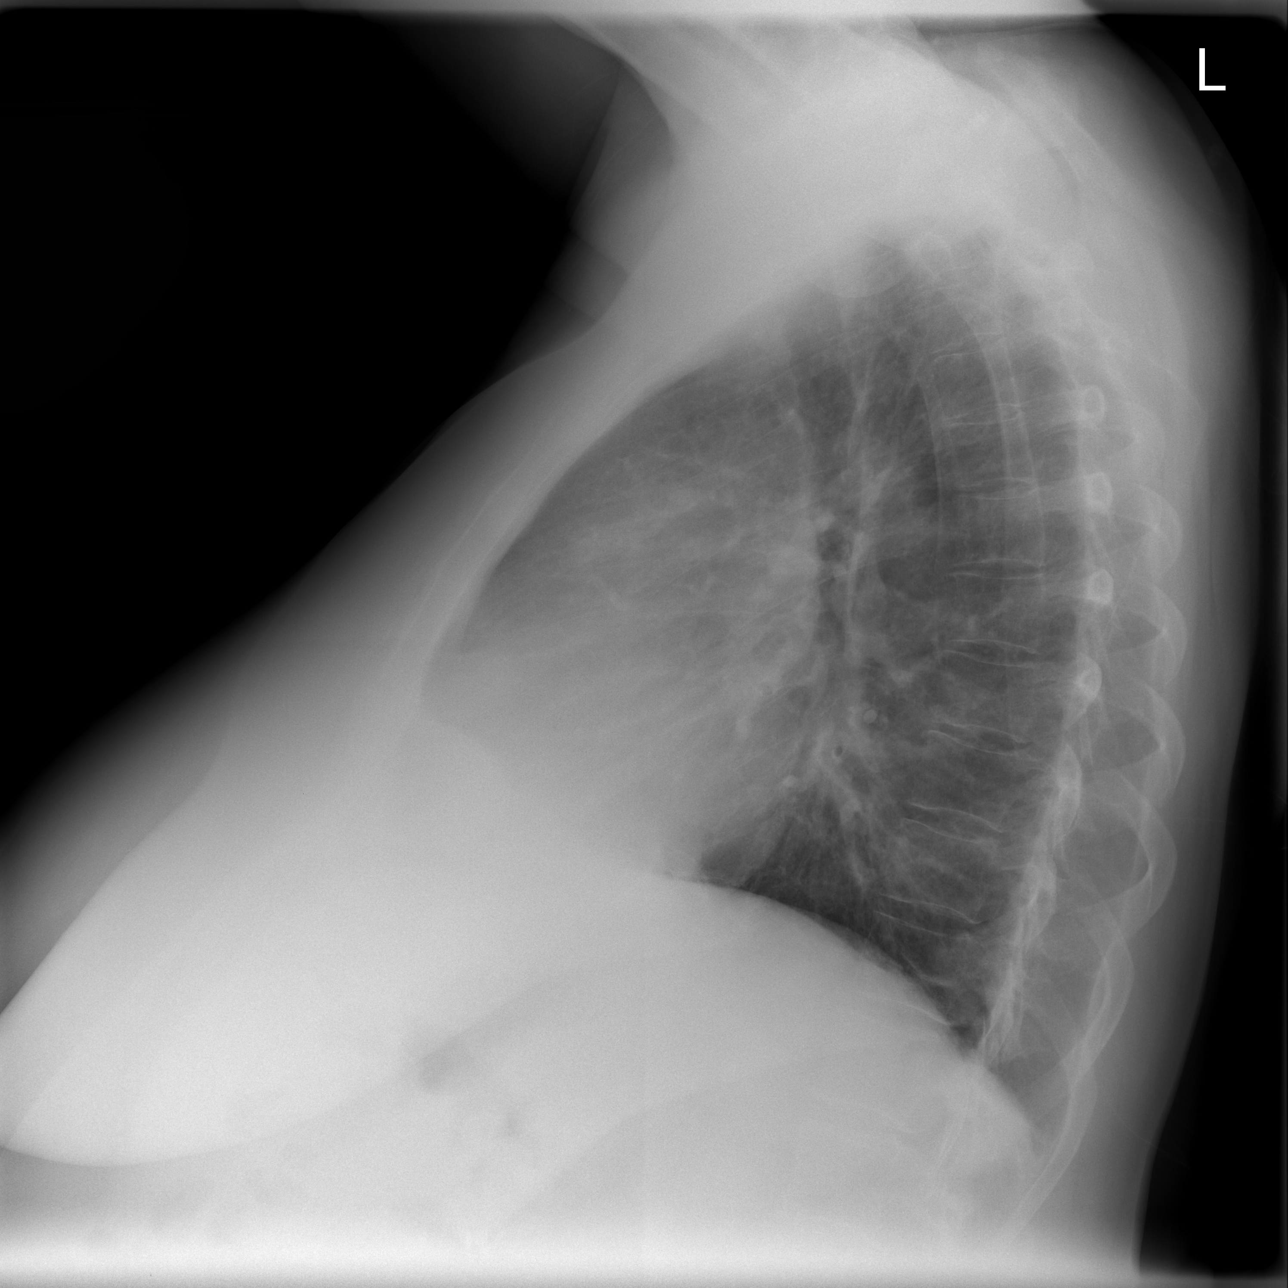

[3 of 3 positions shown; findings below may reference images not displayed]

FINDINGS: Cardiac shadow is within normal limits. The lungs are well aerated
bilaterally. No focal infiltrate or sizable effusion is seen. No
acute bony abnormality is noted.
IMPRESSION: No active cardiopulmonary disease.

## 2016-06-05 DIAGNOSIS — E1169 Type 2 diabetes mellitus with other specified complication: Secondary | ICD-10-CM | POA: Diagnosis not present

## 2016-06-05 DIAGNOSIS — M159 Polyosteoarthritis, unspecified: Secondary | ICD-10-CM | POA: Diagnosis not present

## 2016-06-05 DIAGNOSIS — J209 Acute bronchitis, unspecified: Secondary | ICD-10-CM | POA: Diagnosis not present

## 2016-06-14 DIAGNOSIS — M159 Polyosteoarthritis, unspecified: Secondary | ICD-10-CM | POA: Diagnosis not present

## 2016-06-14 DIAGNOSIS — J449 Chronic obstructive pulmonary disease, unspecified: Secondary | ICD-10-CM | POA: Diagnosis not present

## 2016-06-14 DIAGNOSIS — E1169 Type 2 diabetes mellitus with other specified complication: Secondary | ICD-10-CM | POA: Diagnosis not present

## 2016-08-14 DIAGNOSIS — I10 Essential (primary) hypertension: Secondary | ICD-10-CM | POA: Diagnosis not present

## 2016-08-14 DIAGNOSIS — M159 Polyosteoarthritis, unspecified: Secondary | ICD-10-CM | POA: Diagnosis not present

## 2016-08-14 DIAGNOSIS — E119 Type 2 diabetes mellitus without complications: Secondary | ICD-10-CM | POA: Diagnosis not present

## 2016-08-14 DIAGNOSIS — R110 Nausea: Secondary | ICD-10-CM | POA: Diagnosis not present

## 2016-08-14 DIAGNOSIS — E1169 Type 2 diabetes mellitus with other specified complication: Secondary | ICD-10-CM | POA: Diagnosis not present

## 2016-09-05 DIAGNOSIS — Z96651 Presence of right artificial knee joint: Secondary | ICD-10-CM | POA: Diagnosis not present

## 2016-09-05 DIAGNOSIS — Z471 Aftercare following joint replacement surgery: Secondary | ICD-10-CM | POA: Diagnosis not present

## 2016-09-06 DIAGNOSIS — M5136 Other intervertebral disc degeneration, lumbar region: Secondary | ICD-10-CM | POA: Diagnosis not present

## 2016-09-06 DIAGNOSIS — M5416 Radiculopathy, lumbar region: Secondary | ICD-10-CM | POA: Diagnosis not present

## 2016-09-06 DIAGNOSIS — M545 Low back pain: Secondary | ICD-10-CM | POA: Diagnosis not present

## 2016-09-09 DIAGNOSIS — M79671 Pain in right foot: Secondary | ICD-10-CM | POA: Diagnosis not present

## 2016-09-20 DIAGNOSIS — S92324A Nondisplaced fracture of second metatarsal bone, right foot, initial encounter for closed fracture: Secondary | ICD-10-CM | POA: Diagnosis not present

## 2016-09-20 DIAGNOSIS — M79671 Pain in right foot: Secondary | ICD-10-CM | POA: Diagnosis not present

## 2016-10-11 DIAGNOSIS — S92324D Nondisplaced fracture of second metatarsal bone, right foot, subsequent encounter for fracture with routine healing: Secondary | ICD-10-CM | POA: Diagnosis not present

## 2016-10-17 DIAGNOSIS — E1169 Type 2 diabetes mellitus with other specified complication: Secondary | ICD-10-CM | POA: Diagnosis not present

## 2016-10-17 DIAGNOSIS — M159 Polyosteoarthritis, unspecified: Secondary | ICD-10-CM | POA: Diagnosis not present

## 2016-10-17 DIAGNOSIS — J449 Chronic obstructive pulmonary disease, unspecified: Secondary | ICD-10-CM | POA: Diagnosis not present

## 2016-11-01 DIAGNOSIS — S92324D Nondisplaced fracture of second metatarsal bone, right foot, subsequent encounter for fracture with routine healing: Secondary | ICD-10-CM | POA: Diagnosis not present

## 2016-11-06 DIAGNOSIS — M159 Polyosteoarthritis, unspecified: Secondary | ICD-10-CM | POA: Diagnosis not present

## 2016-11-06 DIAGNOSIS — M25552 Pain in left hip: Secondary | ICD-10-CM | POA: Diagnosis not present

## 2016-11-06 DIAGNOSIS — E1169 Type 2 diabetes mellitus with other specified complication: Secondary | ICD-10-CM | POA: Diagnosis not present

## 2016-11-14 DIAGNOSIS — T1502XA Foreign body in cornea, left eye, initial encounter: Secondary | ICD-10-CM | POA: Diagnosis not present

## 2016-11-15 DIAGNOSIS — T1502XD Foreign body in cornea, left eye, subsequent encounter: Secondary | ICD-10-CM | POA: Diagnosis not present

## 2016-12-11 DIAGNOSIS — M50323 Other cervical disc degeneration at C6-C7 level: Secondary | ICD-10-CM | POA: Diagnosis not present

## 2016-12-11 DIAGNOSIS — M4682 Other specified inflammatory spondylopathies, cervical region: Secondary | ICD-10-CM | POA: Diagnosis not present

## 2016-12-11 DIAGNOSIS — M542 Cervicalgia: Secondary | ICD-10-CM | POA: Diagnosis not present

## 2016-12-18 DIAGNOSIS — M542 Cervicalgia: Secondary | ICD-10-CM | POA: Diagnosis not present

## 2016-12-30 DIAGNOSIS — M50323 Other cervical disc degeneration at C6-C7 level: Secondary | ICD-10-CM | POA: Diagnosis not present

## 2017-01-09 DIAGNOSIS — M25552 Pain in left hip: Secondary | ICD-10-CM | POA: Diagnosis not present

## 2017-01-09 DIAGNOSIS — J449 Chronic obstructive pulmonary disease, unspecified: Secondary | ICD-10-CM | POA: Diagnosis not present

## 2017-01-09 DIAGNOSIS — E1169 Type 2 diabetes mellitus with other specified complication: Secondary | ICD-10-CM | POA: Diagnosis not present

## 2017-01-23 DIAGNOSIS — E1169 Type 2 diabetes mellitus with other specified complication: Secondary | ICD-10-CM | POA: Diagnosis not present

## 2017-01-23 DIAGNOSIS — M50323 Other cervical disc degeneration at C6-C7 level: Secondary | ICD-10-CM | POA: Diagnosis not present

## 2017-01-23 DIAGNOSIS — J449 Chronic obstructive pulmonary disease, unspecified: Secondary | ICD-10-CM | POA: Diagnosis not present

## 2017-01-23 DIAGNOSIS — I10 Essential (primary) hypertension: Secondary | ICD-10-CM | POA: Diagnosis not present

## 2017-01-23 DIAGNOSIS — K219 Gastro-esophageal reflux disease without esophagitis: Secondary | ICD-10-CM | POA: Diagnosis not present

## 2017-01-23 DIAGNOSIS — E119 Type 2 diabetes mellitus without complications: Secondary | ICD-10-CM | POA: Diagnosis not present

## 2017-02-20 DIAGNOSIS — M542 Cervicalgia: Secondary | ICD-10-CM | POA: Diagnosis not present

## 2017-03-06 DIAGNOSIS — E119 Type 2 diabetes mellitus without complications: Secondary | ICD-10-CM | POA: Diagnosis not present

## 2017-03-06 DIAGNOSIS — H11153 Pinguecula, bilateral: Secondary | ICD-10-CM | POA: Diagnosis not present

## 2017-03-07 DIAGNOSIS — M4682 Other specified inflammatory spondylopathies, cervical region: Secondary | ICD-10-CM | POA: Diagnosis not present

## 2017-03-07 DIAGNOSIS — M542 Cervicalgia: Secondary | ICD-10-CM | POA: Diagnosis not present

## 2017-03-12 DIAGNOSIS — E1169 Type 2 diabetes mellitus with other specified complication: Secondary | ICD-10-CM | POA: Diagnosis not present

## 2017-03-12 DIAGNOSIS — M545 Low back pain: Secondary | ICD-10-CM | POA: Diagnosis not present

## 2017-03-12 DIAGNOSIS — M25552 Pain in left hip: Secondary | ICD-10-CM | POA: Diagnosis not present

## 2017-03-26 DIAGNOSIS — J449 Chronic obstructive pulmonary disease, unspecified: Secondary | ICD-10-CM | POA: Diagnosis not present

## 2017-03-26 DIAGNOSIS — K219 Gastro-esophageal reflux disease without esophagitis: Secondary | ICD-10-CM | POA: Diagnosis not present

## 2017-03-26 DIAGNOSIS — J208 Acute bronchitis due to other specified organisms: Secondary | ICD-10-CM | POA: Diagnosis not present

## 2017-04-17 DIAGNOSIS — M542 Cervicalgia: Secondary | ICD-10-CM | POA: Diagnosis not present

## 2017-04-17 DIAGNOSIS — M4682 Other specified inflammatory spondylopathies, cervical region: Secondary | ICD-10-CM | POA: Diagnosis not present

## 2017-05-02 DIAGNOSIS — M542 Cervicalgia: Secondary | ICD-10-CM | POA: Diagnosis not present

## 2017-05-02 DIAGNOSIS — M4682 Other specified inflammatory spondylopathies, cervical region: Secondary | ICD-10-CM | POA: Diagnosis not present

## 2017-05-16 DIAGNOSIS — E1169 Type 2 diabetes mellitus with other specified complication: Secondary | ICD-10-CM | POA: Diagnosis not present

## 2017-05-16 DIAGNOSIS — E119 Type 2 diabetes mellitus without complications: Secondary | ICD-10-CM | POA: Diagnosis not present

## 2017-05-16 DIAGNOSIS — J449 Chronic obstructive pulmonary disease, unspecified: Secondary | ICD-10-CM | POA: Diagnosis not present

## 2017-05-16 DIAGNOSIS — K219 Gastro-esophageal reflux disease without esophagitis: Secondary | ICD-10-CM | POA: Diagnosis not present

## 2017-05-16 DIAGNOSIS — I10 Essential (primary) hypertension: Secondary | ICD-10-CM | POA: Diagnosis not present

## 2017-05-23 DIAGNOSIS — J449 Chronic obstructive pulmonary disease, unspecified: Secondary | ICD-10-CM | POA: Diagnosis not present

## 2017-05-23 DIAGNOSIS — J208 Acute bronchitis due to other specified organisms: Secondary | ICD-10-CM | POA: Diagnosis not present

## 2017-05-23 DIAGNOSIS — I10 Essential (primary) hypertension: Secondary | ICD-10-CM | POA: Diagnosis not present

## 2017-06-06 DIAGNOSIS — Z1231 Encounter for screening mammogram for malignant neoplasm of breast: Secondary | ICD-10-CM | POA: Diagnosis not present

## 2017-06-20 DIAGNOSIS — E119 Type 2 diabetes mellitus without complications: Secondary | ICD-10-CM | POA: Diagnosis not present

## 2017-06-20 DIAGNOSIS — J449 Chronic obstructive pulmonary disease, unspecified: Secondary | ICD-10-CM | POA: Diagnosis not present

## 2017-06-20 DIAGNOSIS — E1169 Type 2 diabetes mellitus with other specified complication: Secondary | ICD-10-CM | POA: Diagnosis not present

## 2017-07-02 DIAGNOSIS — E1169 Type 2 diabetes mellitus with other specified complication: Secondary | ICD-10-CM | POA: Diagnosis not present

## 2017-07-02 DIAGNOSIS — J449 Chronic obstructive pulmonary disease, unspecified: Secondary | ICD-10-CM | POA: Diagnosis not present

## 2017-07-02 DIAGNOSIS — E119 Type 2 diabetes mellitus without complications: Secondary | ICD-10-CM | POA: Diagnosis not present

## 2017-07-16 DIAGNOSIS — J208 Acute bronchitis due to other specified organisms: Secondary | ICD-10-CM | POA: Diagnosis not present

## 2017-07-16 DIAGNOSIS — E1169 Type 2 diabetes mellitus with other specified complication: Secondary | ICD-10-CM | POA: Diagnosis not present

## 2017-07-16 DIAGNOSIS — J449 Chronic obstructive pulmonary disease, unspecified: Secondary | ICD-10-CM | POA: Diagnosis not present

## 2017-07-18 DIAGNOSIS — S81802A Unspecified open wound, left lower leg, initial encounter: Secondary | ICD-10-CM | POA: Diagnosis not present

## 2017-07-18 DIAGNOSIS — S81812A Laceration without foreign body, left lower leg, initial encounter: Secondary | ICD-10-CM | POA: Diagnosis not present

## 2017-07-18 DIAGNOSIS — E1165 Type 2 diabetes mellitus with hyperglycemia: Secondary | ICD-10-CM | POA: Diagnosis not present

## 2017-07-21 DIAGNOSIS — L03116 Cellulitis of left lower limb: Secondary | ICD-10-CM | POA: Diagnosis not present

## 2017-07-21 DIAGNOSIS — E1169 Type 2 diabetes mellitus with other specified complication: Secondary | ICD-10-CM | POA: Diagnosis not present

## 2017-07-21 DIAGNOSIS — J449 Chronic obstructive pulmonary disease, unspecified: Secondary | ICD-10-CM | POA: Diagnosis not present

## 2017-07-28 DIAGNOSIS — T148XXA Other injury of unspecified body region, initial encounter: Secondary | ICD-10-CM | POA: Diagnosis not present

## 2017-07-28 DIAGNOSIS — L03116 Cellulitis of left lower limb: Secondary | ICD-10-CM | POA: Diagnosis not present

## 2017-07-28 DIAGNOSIS — E1169 Type 2 diabetes mellitus with other specified complication: Secondary | ICD-10-CM | POA: Diagnosis not present

## 2017-07-30 DIAGNOSIS — S81812A Laceration without foreign body, left lower leg, initial encounter: Secondary | ICD-10-CM | POA: Diagnosis not present

## 2017-07-30 DIAGNOSIS — E119 Type 2 diabetes mellitus without complications: Secondary | ICD-10-CM | POA: Diagnosis not present

## 2017-07-30 DIAGNOSIS — I10 Essential (primary) hypertension: Secondary | ICD-10-CM | POA: Diagnosis not present

## 2017-07-30 DIAGNOSIS — S81802A Unspecified open wound, left lower leg, initial encounter: Secondary | ICD-10-CM | POA: Diagnosis not present

## 2017-08-01 DIAGNOSIS — S81802A Unspecified open wound, left lower leg, initial encounter: Secondary | ICD-10-CM | POA: Diagnosis not present

## 2017-08-01 DIAGNOSIS — E119 Type 2 diabetes mellitus without complications: Secondary | ICD-10-CM | POA: Diagnosis not present

## 2017-08-01 DIAGNOSIS — I10 Essential (primary) hypertension: Secondary | ICD-10-CM | POA: Diagnosis not present

## 2017-08-04 DIAGNOSIS — S81802A Unspecified open wound, left lower leg, initial encounter: Secondary | ICD-10-CM | POA: Diagnosis not present

## 2017-08-04 DIAGNOSIS — S81812A Laceration without foreign body, left lower leg, initial encounter: Secondary | ICD-10-CM | POA: Diagnosis not present

## 2017-08-04 DIAGNOSIS — E119 Type 2 diabetes mellitus without complications: Secondary | ICD-10-CM | POA: Diagnosis not present

## 2017-08-11 DIAGNOSIS — E119 Type 2 diabetes mellitus without complications: Secondary | ICD-10-CM | POA: Diagnosis not present

## 2017-08-11 DIAGNOSIS — S81802A Unspecified open wound, left lower leg, initial encounter: Secondary | ICD-10-CM | POA: Diagnosis not present

## 2017-08-11 DIAGNOSIS — S81812A Laceration without foreign body, left lower leg, initial encounter: Secondary | ICD-10-CM | POA: Diagnosis not present

## 2017-08-14 DIAGNOSIS — E119 Type 2 diabetes mellitus without complications: Secondary | ICD-10-CM | POA: Diagnosis not present

## 2017-08-14 DIAGNOSIS — E1169 Type 2 diabetes mellitus with other specified complication: Secondary | ICD-10-CM | POA: Diagnosis not present

## 2017-08-14 DIAGNOSIS — I10 Essential (primary) hypertension: Secondary | ICD-10-CM | POA: Diagnosis not present

## 2017-08-14 DIAGNOSIS — J449 Chronic obstructive pulmonary disease, unspecified: Secondary | ICD-10-CM | POA: Diagnosis not present

## 2017-08-18 DIAGNOSIS — S81802A Unspecified open wound, left lower leg, initial encounter: Secondary | ICD-10-CM | POA: Diagnosis not present

## 2017-08-18 DIAGNOSIS — E119 Type 2 diabetes mellitus without complications: Secondary | ICD-10-CM | POA: Diagnosis not present

## 2017-08-18 DIAGNOSIS — S81812A Laceration without foreign body, left lower leg, initial encounter: Secondary | ICD-10-CM | POA: Diagnosis not present

## 2017-08-20 DIAGNOSIS — I10 Essential (primary) hypertension: Secondary | ICD-10-CM | POA: Diagnosis not present

## 2017-08-20 DIAGNOSIS — M545 Low back pain: Secondary | ICD-10-CM | POA: Diagnosis not present

## 2017-08-20 DIAGNOSIS — E1169 Type 2 diabetes mellitus with other specified complication: Secondary | ICD-10-CM | POA: Diagnosis not present

## 2017-08-25 DIAGNOSIS — S81812A Laceration without foreign body, left lower leg, initial encounter: Secondary | ICD-10-CM | POA: Diagnosis not present

## 2017-08-25 DIAGNOSIS — S81802A Unspecified open wound, left lower leg, initial encounter: Secondary | ICD-10-CM | POA: Diagnosis not present

## 2017-08-25 DIAGNOSIS — E119 Type 2 diabetes mellitus without complications: Secondary | ICD-10-CM | POA: Diagnosis not present

## 2017-08-29 DIAGNOSIS — I10 Essential (primary) hypertension: Secondary | ICD-10-CM | POA: Diagnosis not present

## 2017-08-29 DIAGNOSIS — M545 Low back pain: Secondary | ICD-10-CM | POA: Diagnosis not present

## 2017-08-29 DIAGNOSIS — E1169 Type 2 diabetes mellitus with other specified complication: Secondary | ICD-10-CM | POA: Diagnosis not present

## 2017-09-01 DIAGNOSIS — S81802A Unspecified open wound, left lower leg, initial encounter: Secondary | ICD-10-CM | POA: Diagnosis not present

## 2017-09-01 DIAGNOSIS — E119 Type 2 diabetes mellitus without complications: Secondary | ICD-10-CM | POA: Diagnosis not present

## 2017-09-01 DIAGNOSIS — S81812A Laceration without foreign body, left lower leg, initial encounter: Secondary | ICD-10-CM | POA: Diagnosis not present

## 2017-09-08 DIAGNOSIS — S81802A Unspecified open wound, left lower leg, initial encounter: Secondary | ICD-10-CM | POA: Diagnosis not present

## 2017-09-08 DIAGNOSIS — S81812A Laceration without foreign body, left lower leg, initial encounter: Secondary | ICD-10-CM | POA: Diagnosis not present

## 2017-09-08 DIAGNOSIS — E119 Type 2 diabetes mellitus without complications: Secondary | ICD-10-CM | POA: Diagnosis not present

## 2017-09-15 DIAGNOSIS — E119 Type 2 diabetes mellitus without complications: Secondary | ICD-10-CM | POA: Diagnosis not present

## 2017-09-15 DIAGNOSIS — S81802A Unspecified open wound, left lower leg, initial encounter: Secondary | ICD-10-CM | POA: Diagnosis not present

## 2017-09-15 DIAGNOSIS — S81812A Laceration without foreign body, left lower leg, initial encounter: Secondary | ICD-10-CM | POA: Diagnosis not present

## 2017-09-22 DIAGNOSIS — S81802A Unspecified open wound, left lower leg, initial encounter: Secondary | ICD-10-CM | POA: Diagnosis not present

## 2017-09-22 DIAGNOSIS — Z87828 Personal history of other (healed) physical injury and trauma: Secondary | ICD-10-CM | POA: Diagnosis not present

## 2017-09-22 DIAGNOSIS — Z09 Encounter for follow-up examination after completed treatment for conditions other than malignant neoplasm: Secondary | ICD-10-CM | POA: Diagnosis not present

## 2017-10-15 DIAGNOSIS — M545 Low back pain: Secondary | ICD-10-CM | POA: Diagnosis not present

## 2017-10-15 DIAGNOSIS — E1169 Type 2 diabetes mellitus with other specified complication: Secondary | ICD-10-CM | POA: Diagnosis not present

## 2017-10-15 DIAGNOSIS — J208 Acute bronchitis due to other specified organisms: Secondary | ICD-10-CM | POA: Diagnosis not present

## 2017-10-30 DIAGNOSIS — I10 Essential (primary) hypertension: Secondary | ICD-10-CM | POA: Diagnosis not present

## 2017-10-30 DIAGNOSIS — M545 Low back pain: Secondary | ICD-10-CM | POA: Diagnosis not present

## 2017-10-30 DIAGNOSIS — E119 Type 2 diabetes mellitus without complications: Secondary | ICD-10-CM | POA: Diagnosis not present

## 2017-10-30 DIAGNOSIS — J208 Acute bronchitis due to other specified organisms: Secondary | ICD-10-CM | POA: Diagnosis not present

## 2017-10-30 DIAGNOSIS — E1169 Type 2 diabetes mellitus with other specified complication: Secondary | ICD-10-CM | POA: Diagnosis not present

## 2017-11-14 DIAGNOSIS — A6920 Lyme disease, unspecified: Secondary | ICD-10-CM | POA: Diagnosis not present

## 2017-11-14 DIAGNOSIS — A932 Colorado tick fever: Secondary | ICD-10-CM | POA: Diagnosis not present

## 2017-11-17 DIAGNOSIS — W57XXXA Bitten or stung by nonvenomous insect and other nonvenomous arthropods, initial encounter: Secondary | ICD-10-CM | POA: Diagnosis not present

## 2017-11-17 DIAGNOSIS — M545 Low back pain: Secondary | ICD-10-CM | POA: Diagnosis not present

## 2017-11-17 DIAGNOSIS — E1169 Type 2 diabetes mellitus with other specified complication: Secondary | ICD-10-CM | POA: Diagnosis not present

## 2017-12-01 DIAGNOSIS — J449 Chronic obstructive pulmonary disease, unspecified: Secondary | ICD-10-CM | POA: Diagnosis not present

## 2017-12-01 DIAGNOSIS — M545 Low back pain: Secondary | ICD-10-CM | POA: Diagnosis not present

## 2017-12-01 DIAGNOSIS — E1169 Type 2 diabetes mellitus with other specified complication: Secondary | ICD-10-CM | POA: Diagnosis not present

## 2017-12-16 DIAGNOSIS — Z471 Aftercare following joint replacement surgery: Secondary | ICD-10-CM | POA: Diagnosis not present

## 2017-12-16 DIAGNOSIS — Z96651 Presence of right artificial knee joint: Secondary | ICD-10-CM | POA: Diagnosis not present

## 2017-12-16 DIAGNOSIS — M25561 Pain in right knee: Secondary | ICD-10-CM | POA: Diagnosis not present

## 2018-01-13 DIAGNOSIS — S0501XD Injury of conjunctiva and corneal abrasion without foreign body, right eye, subsequent encounter: Secondary | ICD-10-CM | POA: Diagnosis not present

## 2018-02-09 DIAGNOSIS — M79671 Pain in right foot: Secondary | ICD-10-CM | POA: Diagnosis not present

## 2018-02-16 DIAGNOSIS — J449 Chronic obstructive pulmonary disease, unspecified: Secondary | ICD-10-CM | POA: Diagnosis not present

## 2018-02-16 DIAGNOSIS — E1169 Type 2 diabetes mellitus with other specified complication: Secondary | ICD-10-CM | POA: Diagnosis not present

## 2018-02-16 DIAGNOSIS — M545 Low back pain: Secondary | ICD-10-CM | POA: Diagnosis not present

## 2018-02-24 DIAGNOSIS — E1169 Type 2 diabetes mellitus with other specified complication: Secondary | ICD-10-CM | POA: Diagnosis not present

## 2018-02-24 DIAGNOSIS — E119 Type 2 diabetes mellitus without complications: Secondary | ICD-10-CM | POA: Diagnosis not present

## 2018-02-24 DIAGNOSIS — Z23 Encounter for immunization: Secondary | ICD-10-CM | POA: Diagnosis not present

## 2018-02-24 DIAGNOSIS — J449 Chronic obstructive pulmonary disease, unspecified: Secondary | ICD-10-CM | POA: Diagnosis not present

## 2018-02-24 DIAGNOSIS — K219 Gastro-esophageal reflux disease without esophagitis: Secondary | ICD-10-CM | POA: Diagnosis not present

## 2018-02-24 DIAGNOSIS — I10 Essential (primary) hypertension: Secondary | ICD-10-CM | POA: Diagnosis not present

## 2018-03-02 DIAGNOSIS — K219 Gastro-esophageal reflux disease without esophagitis: Secondary | ICD-10-CM | POA: Diagnosis not present

## 2018-03-02 DIAGNOSIS — R600 Localized edema: Secondary | ICD-10-CM | POA: Diagnosis not present

## 2018-03-02 DIAGNOSIS — J449 Chronic obstructive pulmonary disease, unspecified: Secondary | ICD-10-CM | POA: Diagnosis not present

## 2018-03-09 DIAGNOSIS — E119 Type 2 diabetes mellitus without complications: Secondary | ICD-10-CM | POA: Diagnosis not present

## 2018-03-11 DIAGNOSIS — J449 Chronic obstructive pulmonary disease, unspecified: Secondary | ICD-10-CM | POA: Diagnosis not present

## 2018-03-11 DIAGNOSIS — S81811A Laceration without foreign body, right lower leg, initial encounter: Secondary | ICD-10-CM | POA: Diagnosis not present

## 2018-03-11 DIAGNOSIS — I10 Essential (primary) hypertension: Secondary | ICD-10-CM | POA: Diagnosis not present

## 2018-03-13 DIAGNOSIS — L0390 Cellulitis, unspecified: Secondary | ICD-10-CM | POA: Diagnosis not present

## 2018-03-13 DIAGNOSIS — K219 Gastro-esophageal reflux disease without esophagitis: Secondary | ICD-10-CM | POA: Diagnosis not present

## 2018-03-16 DIAGNOSIS — L0390 Cellulitis, unspecified: Secondary | ICD-10-CM | POA: Diagnosis not present

## 2018-03-16 DIAGNOSIS — K219 Gastro-esophageal reflux disease without esophagitis: Secondary | ICD-10-CM | POA: Diagnosis not present

## 2018-03-16 DIAGNOSIS — J449 Chronic obstructive pulmonary disease, unspecified: Secondary | ICD-10-CM | POA: Diagnosis not present

## 2018-03-23 DIAGNOSIS — K219 Gastro-esophageal reflux disease without esophagitis: Secondary | ICD-10-CM | POA: Diagnosis not present

## 2018-03-23 DIAGNOSIS — L0390 Cellulitis, unspecified: Secondary | ICD-10-CM | POA: Diagnosis not present

## 2018-03-30 DIAGNOSIS — L0390 Cellulitis, unspecified: Secondary | ICD-10-CM | POA: Diagnosis not present

## 2018-03-30 DIAGNOSIS — K219 Gastro-esophageal reflux disease without esophagitis: Secondary | ICD-10-CM | POA: Diagnosis not present

## 2018-04-08 DIAGNOSIS — M25551 Pain in right hip: Secondary | ICD-10-CM | POA: Diagnosis not present

## 2018-04-08 DIAGNOSIS — K219 Gastro-esophageal reflux disease without esophagitis: Secondary | ICD-10-CM | POA: Diagnosis not present

## 2018-04-24 DIAGNOSIS — M25551 Pain in right hip: Secondary | ICD-10-CM | POA: Diagnosis not present

## 2018-04-24 DIAGNOSIS — K219 Gastro-esophageal reflux disease without esophagitis: Secondary | ICD-10-CM | POA: Diagnosis not present

## 2018-05-11 DIAGNOSIS — L0390 Cellulitis, unspecified: Secondary | ICD-10-CM | POA: Diagnosis not present

## 2018-05-11 DIAGNOSIS — J0100 Acute maxillary sinusitis, unspecified: Secondary | ICD-10-CM | POA: Diagnosis not present

## 2018-05-11 DIAGNOSIS — K219 Gastro-esophageal reflux disease without esophagitis: Secondary | ICD-10-CM | POA: Diagnosis not present

## 2018-05-22 DIAGNOSIS — M25552 Pain in left hip: Secondary | ICD-10-CM | POA: Diagnosis not present

## 2018-05-22 DIAGNOSIS — K219 Gastro-esophageal reflux disease without esophagitis: Secondary | ICD-10-CM | POA: Diagnosis not present

## 2018-06-10 DIAGNOSIS — K529 Noninfective gastroenteritis and colitis, unspecified: Secondary | ICD-10-CM | POA: Diagnosis not present

## 2018-06-10 DIAGNOSIS — L0390 Cellulitis, unspecified: Secondary | ICD-10-CM | POA: Diagnosis not present

## 2018-06-10 DIAGNOSIS — K219 Gastro-esophageal reflux disease without esophagitis: Secondary | ICD-10-CM | POA: Diagnosis not present

## 2018-06-19 DIAGNOSIS — I10 Essential (primary) hypertension: Secondary | ICD-10-CM | POA: Diagnosis not present

## 2018-06-19 DIAGNOSIS — K219 Gastro-esophageal reflux disease without esophagitis: Secondary | ICD-10-CM | POA: Diagnosis not present

## 2018-06-19 DIAGNOSIS — N959 Unspecified menopausal and perimenopausal disorder: Secondary | ICD-10-CM | POA: Diagnosis not present

## 2018-06-19 DIAGNOSIS — E1169 Type 2 diabetes mellitus with other specified complication: Secondary | ICD-10-CM | POA: Diagnosis not present

## 2018-06-19 DIAGNOSIS — E119 Type 2 diabetes mellitus without complications: Secondary | ICD-10-CM | POA: Diagnosis not present

## 2018-07-06 DIAGNOSIS — J0101 Acute recurrent maxillary sinusitis: Secondary | ICD-10-CM | POA: Diagnosis not present

## 2018-07-06 DIAGNOSIS — K219 Gastro-esophageal reflux disease without esophagitis: Secondary | ICD-10-CM | POA: Diagnosis not present

## 2018-07-17 DIAGNOSIS — K219 Gastro-esophageal reflux disease without esophagitis: Secondary | ICD-10-CM | POA: Diagnosis not present

## 2018-07-17 DIAGNOSIS — M25551 Pain in right hip: Secondary | ICD-10-CM | POA: Diagnosis not present

## 2018-07-17 DIAGNOSIS — M159 Polyosteoarthritis, unspecified: Secondary | ICD-10-CM | POA: Diagnosis not present

## 2018-07-31 DIAGNOSIS — M25551 Pain in right hip: Secondary | ICD-10-CM | POA: Diagnosis not present

## 2018-07-31 DIAGNOSIS — K219 Gastro-esophageal reflux disease without esophagitis: Secondary | ICD-10-CM | POA: Diagnosis not present

## 2018-08-06 DIAGNOSIS — R531 Weakness: Secondary | ICD-10-CM | POA: Diagnosis not present

## 2018-08-06 DIAGNOSIS — R5382 Chronic fatigue, unspecified: Secondary | ICD-10-CM | POA: Diagnosis not present

## 2018-08-07 DIAGNOSIS — Z1231 Encounter for screening mammogram for malignant neoplasm of breast: Secondary | ICD-10-CM | POA: Diagnosis not present

## 2018-08-14 DIAGNOSIS — N959 Unspecified menopausal and perimenopausal disorder: Secondary | ICD-10-CM | POA: Diagnosis not present

## 2018-08-14 DIAGNOSIS — M25551 Pain in right hip: Secondary | ICD-10-CM | POA: Diagnosis not present

## 2018-08-14 DIAGNOSIS — K219 Gastro-esophageal reflux disease without esophagitis: Secondary | ICD-10-CM | POA: Diagnosis not present

## 2018-08-28 DIAGNOSIS — L570 Actinic keratosis: Secondary | ICD-10-CM | POA: Diagnosis not present

## 2018-08-28 DIAGNOSIS — R233 Spontaneous ecchymoses: Secondary | ICD-10-CM | POA: Diagnosis not present

## 2018-08-28 DIAGNOSIS — D485 Neoplasm of uncertain behavior of skin: Secondary | ICD-10-CM | POA: Diagnosis not present

## 2018-08-28 DIAGNOSIS — L821 Other seborrheic keratosis: Secondary | ICD-10-CM | POA: Diagnosis not present

## 2018-08-31 DIAGNOSIS — M25551 Pain in right hip: Secondary | ICD-10-CM | POA: Diagnosis not present

## 2018-08-31 DIAGNOSIS — N959 Unspecified menopausal and perimenopausal disorder: Secondary | ICD-10-CM | POA: Diagnosis not present

## 2018-08-31 DIAGNOSIS — K219 Gastro-esophageal reflux disease without esophagitis: Secondary | ICD-10-CM | POA: Diagnosis not present

## 2018-09-03 DIAGNOSIS — K219 Gastro-esophageal reflux disease without esophagitis: Secondary | ICD-10-CM | POA: Diagnosis not present

## 2018-09-03 DIAGNOSIS — J069 Acute upper respiratory infection, unspecified: Secondary | ICD-10-CM | POA: Diagnosis not present

## 2018-10-01 DIAGNOSIS — K219 Gastro-esophageal reflux disease without esophagitis: Secondary | ICD-10-CM | POA: Diagnosis not present

## 2018-10-01 DIAGNOSIS — M25552 Pain in left hip: Secondary | ICD-10-CM | POA: Diagnosis not present

## 2018-10-01 DIAGNOSIS — J0100 Acute maxillary sinusitis, unspecified: Secondary | ICD-10-CM | POA: Diagnosis not present

## 2018-10-15 DIAGNOSIS — M25552 Pain in left hip: Secondary | ICD-10-CM | POA: Diagnosis not present

## 2018-10-15 DIAGNOSIS — E119 Type 2 diabetes mellitus without complications: Secondary | ICD-10-CM | POA: Diagnosis not present

## 2018-10-15 DIAGNOSIS — J309 Allergic rhinitis, unspecified: Secondary | ICD-10-CM | POA: Diagnosis not present

## 2018-10-15 DIAGNOSIS — K219 Gastro-esophageal reflux disease without esophagitis: Secondary | ICD-10-CM | POA: Diagnosis not present

## 2018-10-15 DIAGNOSIS — E1169 Type 2 diabetes mellitus with other specified complication: Secondary | ICD-10-CM | POA: Diagnosis not present

## 2018-10-15 DIAGNOSIS — I10 Essential (primary) hypertension: Secondary | ICD-10-CM | POA: Diagnosis not present

## 2019-05-07 DIAGNOSIS — F419 Anxiety disorder, unspecified: Secondary | ICD-10-CM

## 2019-05-07 DIAGNOSIS — U071 COVID-19: Secondary | ICD-10-CM

## 2019-05-07 DIAGNOSIS — J9601 Acute respiratory failure with hypoxia: Secondary | ICD-10-CM | POA: Insufficient documentation

## 2019-05-07 DIAGNOSIS — E66813 Obesity, class 3: Secondary | ICD-10-CM

## 2019-05-07 HISTORY — DX: Anxiety disorder, unspecified: F41.9

## 2019-05-07 HISTORY — DX: Obesity, class 3: E66.813

## 2019-05-07 HISTORY — DX: Acute respiratory failure with hypoxia: J96.01

## 2019-05-07 HISTORY — DX: COVID-19: U07.1

## 2019-08-23 ENCOUNTER — Other Ambulatory Visit: Payer: Self-pay | Admitting: Orthopedic Surgery

## 2019-08-23 ENCOUNTER — Other Ambulatory Visit (HOSPITAL_COMMUNITY): Payer: Self-pay | Admitting: Orthopedic Surgery

## 2019-08-23 DIAGNOSIS — M25561 Pain in right knee: Secondary | ICD-10-CM

## 2019-09-06 ENCOUNTER — Encounter (HOSPITAL_COMMUNITY)
Admission: RE | Admit: 2019-09-06 | Discharge: 2019-09-06 | Disposition: A | Discharge status: Home / Self Care | Payer: Commercial Managed Care - PPO | Source: Ambulatory Visit | Attending: Orthopedic Surgery | Admitting: Orthopedic Surgery

## 2019-09-06 ENCOUNTER — Other Ambulatory Visit: Payer: Self-pay

## 2019-09-06 DIAGNOSIS — M25561 Pain in right knee: Secondary | ICD-10-CM | POA: Diagnosis present

## 2019-09-06 MED ORDER — TECHNETIUM TC 99M MEDRONATE IV KIT
20.0000 | PACK | Freq: Once | INTRAVENOUS | Status: AC | PRN
  Administered 2019-09-06: 20 via INTRAVENOUS

## 2021-02-07 ENCOUNTER — Other Ambulatory Visit: Payer: Self-pay | Admitting: Internal Medicine

## 2021-02-07 DIAGNOSIS — R921 Mammographic calcification found on diagnostic imaging of breast: Secondary | ICD-10-CM

## 2021-02-15 ENCOUNTER — Ambulatory Visit
Admission: RE | Admit: 2021-02-15 | Discharge: 2021-02-15 | Disposition: A | Discharge status: Home / Self Care | Payer: Managed Care, Other (non HMO) | Source: Ambulatory Visit | Attending: Internal Medicine | Admitting: Internal Medicine

## 2021-02-15 ENCOUNTER — Other Ambulatory Visit: Payer: Self-pay | Admitting: Internal Medicine

## 2021-02-15 ENCOUNTER — Other Ambulatory Visit: Payer: Self-pay

## 2021-02-15 DIAGNOSIS — R921 Mammographic calcification found on diagnostic imaging of breast: Secondary | ICD-10-CM

## 2021-10-05 DIAGNOSIS — N1832 Chronic kidney disease, stage 3b: Secondary | ICD-10-CM | POA: Diagnosis not present

## 2021-10-05 DIAGNOSIS — J309 Allergic rhinitis, unspecified: Secondary | ICD-10-CM | POA: Diagnosis not present

## 2021-10-05 DIAGNOSIS — K219 Gastro-esophageal reflux disease without esophagitis: Secondary | ICD-10-CM | POA: Diagnosis not present

## 2021-10-05 DIAGNOSIS — M545 Low back pain, unspecified: Secondary | ICD-10-CM | POA: Diagnosis not present

## 2021-10-05 DIAGNOSIS — D649 Anemia, unspecified: Secondary | ICD-10-CM | POA: Diagnosis not present

## 2021-10-05 DIAGNOSIS — K580 Irritable bowel syndrome with diarrhea: Secondary | ICD-10-CM | POA: Diagnosis not present

## 2021-10-05 DIAGNOSIS — N959 Unspecified menopausal and perimenopausal disorder: Secondary | ICD-10-CM | POA: Diagnosis not present

## 2021-10-05 DIAGNOSIS — E8881 Metabolic syndrome: Secondary | ICD-10-CM | POA: Diagnosis not present

## 2021-10-05 DIAGNOSIS — G8929 Other chronic pain: Secondary | ICD-10-CM | POA: Diagnosis not present

## 2021-10-05 DIAGNOSIS — E1169 Type 2 diabetes mellitus with other specified complication: Secondary | ICD-10-CM | POA: Diagnosis not present

## 2021-10-05 DIAGNOSIS — I471 Supraventricular tachycardia: Secondary | ICD-10-CM | POA: Diagnosis not present

## 2021-10-05 DIAGNOSIS — R1310 Dysphagia, unspecified: Secondary | ICD-10-CM | POA: Diagnosis not present

## 2021-10-12 DIAGNOSIS — G5600 Carpal tunnel syndrome, unspecified upper limb: Secondary | ICD-10-CM | POA: Diagnosis not present

## 2021-10-12 DIAGNOSIS — M25552 Pain in left hip: Secondary | ICD-10-CM | POA: Diagnosis not present

## 2021-10-12 DIAGNOSIS — J309 Allergic rhinitis, unspecified: Secondary | ICD-10-CM | POA: Diagnosis not present

## 2021-10-12 DIAGNOSIS — K219 Gastro-esophageal reflux disease without esophagitis: Secondary | ICD-10-CM | POA: Diagnosis not present

## 2021-10-12 DIAGNOSIS — G8929 Other chronic pain: Secondary | ICD-10-CM | POA: Diagnosis not present

## 2021-10-12 DIAGNOSIS — K580 Irritable bowel syndrome with diarrhea: Secondary | ICD-10-CM | POA: Diagnosis not present

## 2021-10-12 DIAGNOSIS — N1832 Chronic kidney disease, stage 3b: Secondary | ICD-10-CM | POA: Diagnosis not present

## 2021-10-12 DIAGNOSIS — R1310 Dysphagia, unspecified: Secondary | ICD-10-CM | POA: Diagnosis not present

## 2021-10-12 DIAGNOSIS — N959 Unspecified menopausal and perimenopausal disorder: Secondary | ICD-10-CM | POA: Diagnosis not present

## 2021-10-12 DIAGNOSIS — I471 Supraventricular tachycardia: Secondary | ICD-10-CM | POA: Diagnosis not present

## 2021-10-12 DIAGNOSIS — M545 Low back pain, unspecified: Secondary | ICD-10-CM | POA: Diagnosis not present

## 2021-10-12 DIAGNOSIS — D649 Anemia, unspecified: Secondary | ICD-10-CM | POA: Diagnosis not present

## 2021-10-26 DIAGNOSIS — I471 Supraventricular tachycardia: Secondary | ICD-10-CM | POA: Diagnosis not present

## 2021-10-26 DIAGNOSIS — G8929 Other chronic pain: Secondary | ICD-10-CM | POA: Diagnosis not present

## 2021-10-26 DIAGNOSIS — Z6837 Body mass index (BMI) 37.0-37.9, adult: Secondary | ICD-10-CM | POA: Diagnosis not present

## 2021-10-26 DIAGNOSIS — M545 Low back pain, unspecified: Secondary | ICD-10-CM | POA: Diagnosis not present

## 2021-10-26 DIAGNOSIS — E1169 Type 2 diabetes mellitus with other specified complication: Secondary | ICD-10-CM | POA: Diagnosis not present

## 2021-10-26 DIAGNOSIS — R600 Localized edema: Secondary | ICD-10-CM | POA: Diagnosis not present

## 2021-10-26 DIAGNOSIS — J309 Allergic rhinitis, unspecified: Secondary | ICD-10-CM | POA: Diagnosis not present

## 2021-10-26 DIAGNOSIS — D649 Anemia, unspecified: Secondary | ICD-10-CM | POA: Diagnosis not present

## 2021-10-26 DIAGNOSIS — K219 Gastro-esophageal reflux disease without esophagitis: Secondary | ICD-10-CM | POA: Diagnosis not present

## 2021-10-28 DIAGNOSIS — S63602A Unspecified sprain of left thumb, initial encounter: Secondary | ICD-10-CM | POA: Diagnosis not present

## 2021-10-28 DIAGNOSIS — S4991XA Unspecified injury of right shoulder and upper arm, initial encounter: Secondary | ICD-10-CM | POA: Diagnosis not present

## 2021-10-30 DIAGNOSIS — M25511 Pain in right shoulder: Secondary | ICD-10-CM | POA: Diagnosis not present

## 2021-11-08 DIAGNOSIS — G8929 Other chronic pain: Secondary | ICD-10-CM | POA: Diagnosis not present

## 2021-11-08 DIAGNOSIS — N959 Unspecified menopausal and perimenopausal disorder: Secondary | ICD-10-CM | POA: Diagnosis not present

## 2021-11-08 DIAGNOSIS — R1310 Dysphagia, unspecified: Secondary | ICD-10-CM | POA: Diagnosis not present

## 2021-11-08 DIAGNOSIS — D649 Anemia, unspecified: Secondary | ICD-10-CM | POA: Diagnosis not present

## 2021-11-08 DIAGNOSIS — I471 Supraventricular tachycardia: Secondary | ICD-10-CM | POA: Diagnosis not present

## 2021-11-08 DIAGNOSIS — E1169 Type 2 diabetes mellitus with other specified complication: Secondary | ICD-10-CM | POA: Diagnosis not present

## 2021-11-08 DIAGNOSIS — J309 Allergic rhinitis, unspecified: Secondary | ICD-10-CM | POA: Diagnosis not present

## 2021-11-08 DIAGNOSIS — R600 Localized edema: Secondary | ICD-10-CM | POA: Diagnosis not present

## 2021-11-08 DIAGNOSIS — M545 Low back pain, unspecified: Secondary | ICD-10-CM | POA: Diagnosis not present

## 2021-11-08 DIAGNOSIS — K580 Irritable bowel syndrome with diarrhea: Secondary | ICD-10-CM | POA: Diagnosis not present

## 2021-11-08 DIAGNOSIS — N1832 Chronic kidney disease, stage 3b: Secondary | ICD-10-CM | POA: Diagnosis not present

## 2021-11-08 DIAGNOSIS — K219 Gastro-esophageal reflux disease without esophagitis: Secondary | ICD-10-CM | POA: Diagnosis not present

## 2021-11-09 DIAGNOSIS — M25511 Pain in right shoulder: Secondary | ICD-10-CM | POA: Diagnosis not present

## 2021-11-21 DIAGNOSIS — M25511 Pain in right shoulder: Secondary | ICD-10-CM | POA: Diagnosis not present

## 2021-11-21 DIAGNOSIS — M75101 Unspecified rotator cuff tear or rupture of right shoulder, not specified as traumatic: Secondary | ICD-10-CM | POA: Diagnosis not present

## 2021-11-22 DIAGNOSIS — N959 Unspecified menopausal and perimenopausal disorder: Secondary | ICD-10-CM | POA: Diagnosis not present

## 2021-11-22 DIAGNOSIS — R600 Localized edema: Secondary | ICD-10-CM | POA: Diagnosis not present

## 2021-11-22 DIAGNOSIS — D649 Anemia, unspecified: Secondary | ICD-10-CM | POA: Diagnosis not present

## 2021-11-22 DIAGNOSIS — G8929 Other chronic pain: Secondary | ICD-10-CM | POA: Diagnosis not present

## 2021-11-22 DIAGNOSIS — I471 Supraventricular tachycardia: Secondary | ICD-10-CM | POA: Diagnosis not present

## 2021-11-22 DIAGNOSIS — R1310 Dysphagia, unspecified: Secondary | ICD-10-CM | POA: Diagnosis not present

## 2021-11-22 DIAGNOSIS — K219 Gastro-esophageal reflux disease without esophagitis: Secondary | ICD-10-CM | POA: Diagnosis not present

## 2021-11-22 DIAGNOSIS — J309 Allergic rhinitis, unspecified: Secondary | ICD-10-CM | POA: Diagnosis not present

## 2021-11-22 DIAGNOSIS — E1169 Type 2 diabetes mellitus with other specified complication: Secondary | ICD-10-CM | POA: Diagnosis not present

## 2021-11-22 DIAGNOSIS — N1832 Chronic kidney disease, stage 3b: Secondary | ICD-10-CM | POA: Diagnosis not present

## 2021-11-22 DIAGNOSIS — M545 Low back pain, unspecified: Secondary | ICD-10-CM | POA: Diagnosis not present

## 2021-11-26 DIAGNOSIS — M25511 Pain in right shoulder: Secondary | ICD-10-CM | POA: Diagnosis not present

## 2021-11-26 DIAGNOSIS — M75101 Unspecified rotator cuff tear or rupture of right shoulder, not specified as traumatic: Secondary | ICD-10-CM | POA: Diagnosis not present

## 2021-12-03 DIAGNOSIS — M1990 Unspecified osteoarthritis, unspecified site: Secondary | ICD-10-CM | POA: Diagnosis not present

## 2021-12-03 DIAGNOSIS — Z886 Allergy status to analgesic agent status: Secondary | ICD-10-CM | POA: Diagnosis not present

## 2021-12-03 DIAGNOSIS — R0602 Shortness of breath: Secondary | ICD-10-CM | POA: Diagnosis not present

## 2021-12-03 DIAGNOSIS — R000 Tachycardia, unspecified: Secondary | ICD-10-CM | POA: Diagnosis not present

## 2021-12-03 DIAGNOSIS — Z794 Long term (current) use of insulin: Secondary | ICD-10-CM | POA: Diagnosis not present

## 2021-12-03 DIAGNOSIS — Z87442 Personal history of urinary calculi: Secondary | ICD-10-CM | POA: Diagnosis not present

## 2021-12-03 DIAGNOSIS — I10 Essential (primary) hypertension: Secondary | ICD-10-CM

## 2021-12-03 DIAGNOSIS — E119 Type 2 diabetes mellitus without complications: Secondary | ICD-10-CM

## 2021-12-03 DIAGNOSIS — J449 Chronic obstructive pulmonary disease, unspecified: Secondary | ICD-10-CM | POA: Diagnosis not present

## 2021-12-03 DIAGNOSIS — I4891 Unspecified atrial fibrillation: Secondary | ICD-10-CM

## 2021-12-03 DIAGNOSIS — E1165 Type 2 diabetes mellitus with hyperglycemia: Secondary | ICD-10-CM | POA: Diagnosis not present

## 2021-12-03 DIAGNOSIS — J9811 Atelectasis: Secondary | ICD-10-CM | POA: Diagnosis not present

## 2021-12-03 DIAGNOSIS — Z23 Encounter for immunization: Secondary | ICD-10-CM | POA: Diagnosis not present

## 2021-12-03 DIAGNOSIS — F419 Anxiety disorder, unspecified: Secondary | ICD-10-CM | POA: Diagnosis not present

## 2021-12-03 DIAGNOSIS — K219 Gastro-esophageal reflux disease without esophagitis: Secondary | ICD-10-CM | POA: Diagnosis not present

## 2021-12-03 DIAGNOSIS — Z881 Allergy status to other antibiotic agents status: Secondary | ICD-10-CM | POA: Diagnosis not present

## 2021-12-03 DIAGNOSIS — Z87891 Personal history of nicotine dependence: Secondary | ICD-10-CM | POA: Diagnosis not present

## 2021-12-03 DIAGNOSIS — I361 Nonrheumatic tricuspid (valve) insufficiency: Secondary | ICD-10-CM | POA: Diagnosis not present

## 2021-12-03 DIAGNOSIS — Z7901 Long term (current) use of anticoagulants: Secondary | ICD-10-CM | POA: Diagnosis not present

## 2021-12-03 DIAGNOSIS — Z79899 Other long term (current) drug therapy: Secondary | ICD-10-CM | POA: Diagnosis not present

## 2021-12-03 DIAGNOSIS — F32A Depression, unspecified: Secondary | ICD-10-CM | POA: Diagnosis not present

## 2021-12-04 DIAGNOSIS — I4891 Unspecified atrial fibrillation: Secondary | ICD-10-CM | POA: Diagnosis not present

## 2021-12-04 DIAGNOSIS — I10 Essential (primary) hypertension: Secondary | ICD-10-CM | POA: Diagnosis not present

## 2021-12-04 DIAGNOSIS — E119 Type 2 diabetes mellitus without complications: Secondary | ICD-10-CM | POA: Diagnosis not present

## 2021-12-05 ENCOUNTER — Telehealth: Payer: Self-pay | Admitting: Cardiology

## 2021-12-05 NOTE — Telephone Encounter (Signed)
Patient was in [facility] and was told to follow up with Dr. Bing Matter in 3 weeks.  If available I found is 9/20 which I schedule her for.  She will be out of medication by then.  She wants to know if it's okay to wait until then and will her medication be refilled prior to her visit.

## 2021-12-07 DIAGNOSIS — N1832 Chronic kidney disease, stage 3b: Secondary | ICD-10-CM | POA: Diagnosis not present

## 2021-12-07 DIAGNOSIS — N959 Unspecified menopausal and perimenopausal disorder: Secondary | ICD-10-CM | POA: Diagnosis not present

## 2021-12-07 DIAGNOSIS — E1169 Type 2 diabetes mellitus with other specified complication: Secondary | ICD-10-CM | POA: Diagnosis not present

## 2021-12-07 DIAGNOSIS — K219 Gastro-esophageal reflux disease without esophagitis: Secondary | ICD-10-CM | POA: Diagnosis not present

## 2021-12-07 DIAGNOSIS — R600 Localized edema: Secondary | ICD-10-CM | POA: Diagnosis not present

## 2021-12-07 DIAGNOSIS — I471 Supraventricular tachycardia: Secondary | ICD-10-CM | POA: Diagnosis not present

## 2021-12-07 DIAGNOSIS — G8929 Other chronic pain: Secondary | ICD-10-CM | POA: Diagnosis not present

## 2021-12-07 DIAGNOSIS — M545 Low back pain, unspecified: Secondary | ICD-10-CM | POA: Diagnosis not present

## 2021-12-07 DIAGNOSIS — D649 Anemia, unspecified: Secondary | ICD-10-CM | POA: Diagnosis not present

## 2021-12-07 DIAGNOSIS — I4891 Unspecified atrial fibrillation: Secondary | ICD-10-CM | POA: Diagnosis not present

## 2021-12-07 DIAGNOSIS — J309 Allergic rhinitis, unspecified: Secondary | ICD-10-CM | POA: Diagnosis not present

## 2021-12-10 DIAGNOSIS — I471 Supraventricular tachycardia: Secondary | ICD-10-CM | POA: Diagnosis not present

## 2021-12-10 DIAGNOSIS — R600 Localized edema: Secondary | ICD-10-CM | POA: Diagnosis not present

## 2021-12-10 DIAGNOSIS — I4891 Unspecified atrial fibrillation: Secondary | ICD-10-CM | POA: Diagnosis not present

## 2021-12-10 DIAGNOSIS — M545 Low back pain, unspecified: Secondary | ICD-10-CM | POA: Diagnosis not present

## 2021-12-10 DIAGNOSIS — J309 Allergic rhinitis, unspecified: Secondary | ICD-10-CM | POA: Diagnosis not present

## 2021-12-10 DIAGNOSIS — Z6836 Body mass index (BMI) 36.0-36.9, adult: Secondary | ICD-10-CM | POA: Diagnosis not present

## 2021-12-10 DIAGNOSIS — G8929 Other chronic pain: Secondary | ICD-10-CM | POA: Diagnosis not present

## 2021-12-10 DIAGNOSIS — E1169 Type 2 diabetes mellitus with other specified complication: Secondary | ICD-10-CM | POA: Diagnosis not present

## 2021-12-13 ENCOUNTER — Telehealth: Payer: Self-pay | Admitting: Cardiology

## 2021-12-13 ENCOUNTER — Telehealth: Payer: Self-pay

## 2021-12-13 NOTE — Telephone Encounter (Signed)
Spoke with pt and advised per Dr. Bing Matter to come for a nurse visit on Friday 12-14-21 for an EKG. Pt agreed and verbalized understanding.

## 2021-12-13 NOTE — Telephone Encounter (Signed)
STAT if HR is under 50 or over 120 (normal HR is 60-100 beats per minute)  What is your heart rate?  Ranging  from 130 sitting and moving around 170  Do you have a log of your heart rate readings (document readings)?   Do you have any other symptoms? Shortness of breath- patient wants to be seen asap please

## 2021-12-13 NOTE — Telephone Encounter (Signed)
Pt reported that Dr. Nedra Hai and the Dr. At the hospital gave her Cardizem and has been adjusting according to her HR. She is taking 180mg  with an additional 30mg  if her HR is 120 or greater. She stated that sometimes her HR is as high as 130 resting and can get up to 170 when she is up walking. During this time she is short of breath. She continues to take valsartan-hydrochlorothiazide (DIOVAN-HCT) 80-12.5 MG per tablet, rivaroxaban (XARELTO) 10 MG TABS tablet, rivaroxaban (XARELTO) 10 MG TABS tablet.

## 2021-12-14 ENCOUNTER — Ambulatory Visit (INDEPENDENT_AMBULATORY_CARE_PROVIDER_SITE_OTHER): Payer: PPO

## 2021-12-14 VITALS — BP 114/62 | HR 81 | Ht 64.0 in | Wt 219.0 lb

## 2021-12-14 DIAGNOSIS — I499 Cardiac arrhythmia, unspecified: Secondary | ICD-10-CM

## 2021-12-14 NOTE — Progress Notes (Signed)
   Nurse Visit   Date of Encounter: 12/14/2021 ID: SHAUNI HENNER, DOB 09/22/1955, MRN [id number]  PCP:  Simone Curia, MD   [facility] Providers Cardiologist:  Dr. Damian Leavell to update primary MD,subspecialty MD or APP then REFRESH:1}     Visit Details   VS:  BP 114/62 (BP Location: Right Arm, Patient Position: Sitting)   Pulse 81   Ht 5\' 4"  (1.626 m)   Wt 219 lb (99.3 kg)   SpO2 95%   BMI 37.59 kg/m  , BMI Body mass index is 37.59 kg/m.  Wt Readings from Last 3 Encounters:  12/14/21 219 lb (99.3 kg)  06/11/13 250 lb (113.4 kg)  06/02/13 250 lb (113.4 kg)     Reason for visit: Heart rate fluctuating up and down Performed today: Vitals, EKG, Provider consulted, Education Changes (medications, testing, etc.) : Long term monitor ordered Length of Visit: 25 minutes    Medications Adjustments/Labs and Tests Ordered: Orders Placed This Encounter  Procedures   LONG TERM MONITOR (3-14 DAYS)   EKG 12-Lead   No orders of the defined types were placed in this encounter.    Signed, 06/04/13, RN  12/14/2021 8:22 AM

## 2021-12-18 ENCOUNTER — Encounter: Payer: Self-pay | Admitting: Cardiology

## 2021-12-18 ENCOUNTER — Ambulatory Visit (INDEPENDENT_AMBULATORY_CARE_PROVIDER_SITE_OTHER): Payer: PPO | Admitting: Cardiology

## 2021-12-18 ENCOUNTER — Telehealth (HOSPITAL_COMMUNITY): Payer: Self-pay | Admitting: *Deleted

## 2021-12-18 VITALS — BP 140/78 | HR 74 | Ht 64.0 in | Wt 221.0 lb

## 2021-12-18 DIAGNOSIS — I480 Paroxysmal atrial fibrillation: Secondary | ICD-10-CM | POA: Insufficient documentation

## 2021-12-18 DIAGNOSIS — E669 Obesity, unspecified: Secondary | ICD-10-CM

## 2021-12-18 DIAGNOSIS — I10 Essential (primary) hypertension: Secondary | ICD-10-CM

## 2021-12-18 DIAGNOSIS — E782 Mixed hyperlipidemia: Secondary | ICD-10-CM

## 2021-12-18 DIAGNOSIS — I4891 Unspecified atrial fibrillation: Secondary | ICD-10-CM

## 2021-12-18 DIAGNOSIS — E088 Diabetes mellitus due to underlying condition with unspecified complications: Secondary | ICD-10-CM

## 2021-12-18 HISTORY — DX: Essential (primary) hypertension: I10

## 2021-12-18 HISTORY — DX: Unspecified atrial fibrillation: I48.91

## 2021-12-18 HISTORY — DX: Obesity, unspecified: E66.9

## 2021-12-18 HISTORY — DX: Diabetes mellitus due to underlying condition with unspecified complications: E08.8

## 2021-12-18 HISTORY — DX: Mixed hyperlipidemia: E78.2
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
## 2021-12-18 NOTE — Patient Instructions (Signed)
Medication Instructions:  Your physician recommends that you continue on your current medications as directed. Please refer to the Current Medication list given to you today.  *If you need a refill on your cardiac medications before your next appointment, please call your pharmacy*   Lab Work: None ordered If you have labs (blood work) drawn today and your tests are completely normal, you will receive your results only by: MyChart Message (if you have MyChart) OR A paper copy in the mail If you have any lab test that is abnormal or we need to change your treatment, we will call you to review the results.   Testing/Procedures: Your physician has requested that you have a lexiscan myoview. For further information please visit [email or web address]. Please follow instruction sheet, as given.  The test will take approximately 3 to 4 hours to complete; you may bring reading material.  If someone comes with you to your appointment, they will need to remain in the main lobby due to limited space in the testing area. **If you are pregnant or breastfeeding, please notify the nuclear lab prior to your appointment**  How to prepare for your Myocardial Perfusion Test: Do not eat or drink 3 hours prior to your test, except you may have water. Do not consume products containing caffeine (regular or decaffeinated) 12 hours prior to your test. (ex: coffee, chocolate, sodas, tea). Do bring a list of your current medications with you.  If not listed below, you may take your medications as normal. Do wear comfortable clothes (no dresses or overalls) and walking shoes, tennis shoes preferred (No heels or open toe shoes are allowed). Do NOT wear cologne, perfume, aftershave, or lotions (deodorant is allowed). If these instructions are not followed, your test will have to be rescheduled.    Follow-Up: At [facility], you and your health needs are our priority.  As part of our continuing mission to provide  you with exceptional heart care, we have created designated Provider Care Teams.  These Care Teams include your primary Cardiologist (physician) and Advanced Practice Providers (APPs -  Physician Assistants and Nurse Practitioners) who all work together to provide you with the care you need, when you need it.  We recommend signing up for the patient portal called "MyChart".  Sign up information is provided on this After Visit Summary.  MyChart is used to connect with patients for Virtual Visits (Telemedicine).  Patients are able to view lab/test results, encounter notes, upcoming appointments, etc.  Non-urgent messages can be sent to your provider as well.   To learn more about what you can do with MyChart, go to [email or web address].    Your next appointment:   1 month(s)  The format for your next appointment:   In Person  Provider:   Rajan Revankar, MD   Other Instructions Cardiac Nuclear Scan A cardiac nuclear scan is a test that is done to check the flow of blood to your heart. It is done when you are resting and when you are exercising. The test looks for problems such as: Not enough blood reaching a portion of the heart. The heart muscle not working as it should. You may need this test if: You have heart disease. You have had lab results that are not normal. You have had heart surgery or a balloon procedure to open up blocked arteries (angioplasty). You have chest pain. You have shortness of breath. In this test, a special dye (tracer) is put into your bloodstream. The tracer will travel   to your heart. A camera will then take pictures of your heart to see how the tracer moves through your heart. This test is usually done at a hospital and takes 2-4 hours. Tell a doctor about: Any allergies you have. All medicines you are taking, including vitamins, herbs, eye drops, creams, and over-the-counter medicines. Any problems you or family members have had with anesthetic  medicines. Any blood disorders you have. Any surgeries you have had. Any medical conditions you have. Whether you are pregnant or may be pregnant. What are the risks? Generally, this is a safe test. However, problems may occur, such as: Serious chest pain and heart attack. This is only a risk if the stress portion of the test is done. Rapid heartbeat. A feeling of warmth in your chest. This feeling usually does not last long. Allergic reaction to the tracer. What happens before the test? Ask your doctor about changing or stopping your normal medicines. This is important. Follow instructions from your doctor about what you cannot eat or drink. Remove your jewelry on the day of the test. What happens during the test? An IV tube will be inserted into one of your veins. Your doctor will give you a small amount of tracer through the IV tube. You will wait for 20-40 minutes while the tracer moves through your bloodstream. Your heart will be monitored with an electrocardiogram (ECG). You will lie down on an exam table. Pictures of your heart will be taken for about 15-20 minutes. You may also have a stress test. For this test, one of these things may be done: You will be asked to exercise on a treadmill or a stationary bike. You will be given medicines that will make your heart work harder. This is done if you are unable to exercise. When blood flow to your heart has peaked, a tracer will again be given through the IV tube. After 20-40 minutes, you will get back on the exam table. More pictures will be taken of your heart. Depending on the tracer that is used, more pictures may need to be taken 3-4 hours later. Your IV tube will be removed when the test is over. The test may vary among doctors and hospitals. What happens after the test? Ask your doctor: Whether you can return to your normal schedule, including diet, activities, and medicines. Whether you should drink more fluids. This will  help to remove the tracer from your body. Drink enough fluid to keep your pee (urine) pale yellow. Ask your doctor, or the department that is doing the test: When will my results be ready? How will I get my results? Summary A cardiac nuclear scan is a test that is done to check the flow of blood to your heart. Tell your doctor whether you are pregnant or may be pregnant. Before the test, ask your doctor about changing or stopping your normal medicines. This is important. Ask your doctor whether you can return to your normal activities. You may be asked to drink more fluids. This information is not intended to replace advice given to you by your health care provider. Make sure you discuss any questions you have with your health care provider. Document Revised: 09/09/2018 Document Reviewed: 11/03/2017 Elsevier Patient Education  2021 Elsevier Inc.    

## 2021-12-18 NOTE — Telephone Encounter (Signed)
Patient given detailed instructions per Myocardial Perfusion Study Information Sheet for the test on 12/19/21 Patient notified to arrive 15 minutes early and that it is imperative to arrive on time for appointment to keep from having the test rescheduled.  If you need to cancel or reschedule your appointment, please call the office within 24 hours of your appointment. . Patient verbalized understanding.Ricky Ala

## 2021-12-19 ENCOUNTER — Ambulatory Visit (INDEPENDENT_AMBULATORY_CARE_PROVIDER_SITE_OTHER): Payer: PPO

## 2021-12-19 DIAGNOSIS — I4891 Unspecified atrial fibrillation: Secondary | ICD-10-CM | POA: Diagnosis not present

## 2021-12-19 LAB — MYOCARDIAL PERFUSION IMAGING
LV dias vol: 55 mL (ref 46–106)
LV sys vol: 10 mL
Nuc Stress EF: 82 %
Peak HR: 114 {beats}/min
Rest HR: 87 {beats}/min
Rest Nuclear Isotope Dose: 10.9 mCi
SDS: 0
SRS: 0
SSS: 0
Stress Nuclear Isotope Dose: 32.1 mCi
TID: 0.92

## 2021-12-19 MED ORDER — TECHNETIUM TC 99M TETROFOSMIN IV KIT
32.1000 | PACK | Freq: Once | INTRAVENOUS | Status: AC | PRN
  Administered 2021-12-19: 32.1 via INTRAVENOUS

## 2021-12-19 MED ORDER — TECHNETIUM TC 99M TETROFOSMIN IV KIT
10.9000 | PACK | Freq: Once | INTRAVENOUS | Status: AC | PRN
  Administered 2021-12-19: 10.9 via INTRAVENOUS

## 2021-12-19 MED ORDER — REGADENOSON 0.4 MG/5ML IV SOLN
0.4000 mg | Freq: Once | INTRAVENOUS | Status: AC
  Administered 2021-12-19: 0.4 mg via INTRAVENOUS

## 2022-01-04 DIAGNOSIS — N1832 Chronic kidney disease, stage 3b: Secondary | ICD-10-CM | POA: Diagnosis not present

## 2022-01-04 DIAGNOSIS — M545 Low back pain, unspecified: Secondary | ICD-10-CM | POA: Diagnosis not present

## 2022-01-04 DIAGNOSIS — E1169 Type 2 diabetes mellitus with other specified complication: Secondary | ICD-10-CM | POA: Diagnosis not present

## 2022-01-04 DIAGNOSIS — K219 Gastro-esophageal reflux disease without esophagitis: Secondary | ICD-10-CM | POA: Diagnosis not present

## 2022-01-04 DIAGNOSIS — G8929 Other chronic pain: Secondary | ICD-10-CM | POA: Diagnosis not present

## 2022-01-04 DIAGNOSIS — I4891 Unspecified atrial fibrillation: Secondary | ICD-10-CM | POA: Diagnosis not present

## 2022-01-04 DIAGNOSIS — L0390 Cellulitis, unspecified: Secondary | ICD-10-CM | POA: Diagnosis not present

## 2022-01-04 DIAGNOSIS — R600 Localized edema: Secondary | ICD-10-CM | POA: Diagnosis not present

## 2022-01-04 DIAGNOSIS — J309 Allergic rhinitis, unspecified: Secondary | ICD-10-CM | POA: Diagnosis not present

## 2022-01-04 DIAGNOSIS — I471 Supraventricular tachycardia: Secondary | ICD-10-CM | POA: Diagnosis not present

## 2022-01-04 DIAGNOSIS — D649 Anemia, unspecified: Secondary | ICD-10-CM | POA: Diagnosis not present

## 2022-01-08 DIAGNOSIS — I471 Supraventricular tachycardia: Secondary | ICD-10-CM | POA: Diagnosis not present

## 2022-01-08 DIAGNOSIS — J309 Allergic rhinitis, unspecified: Secondary | ICD-10-CM | POA: Diagnosis not present

## 2022-01-08 DIAGNOSIS — M5416 Radiculopathy, lumbar region: Secondary | ICD-10-CM

## 2022-01-08 DIAGNOSIS — K219 Gastro-esophageal reflux disease without esophagitis: Secondary | ICD-10-CM | POA: Diagnosis not present

## 2022-01-08 DIAGNOSIS — M961 Postlaminectomy syndrome, not elsewhere classified: Secondary | ICD-10-CM

## 2022-01-08 DIAGNOSIS — E1169 Type 2 diabetes mellitus with other specified complication: Secondary | ICD-10-CM | POA: Diagnosis not present

## 2022-01-08 DIAGNOSIS — I4891 Unspecified atrial fibrillation: Secondary | ICD-10-CM | POA: Diagnosis not present

## 2022-01-08 DIAGNOSIS — M545 Low back pain, unspecified: Secondary | ICD-10-CM | POA: Diagnosis not present

## 2022-01-08 DIAGNOSIS — D649 Anemia, unspecified: Secondary | ICD-10-CM | POA: Diagnosis not present

## 2022-01-08 DIAGNOSIS — R600 Localized edema: Secondary | ICD-10-CM | POA: Diagnosis not present

## 2022-01-08 DIAGNOSIS — L0390 Cellulitis, unspecified: Secondary | ICD-10-CM | POA: Diagnosis not present

## 2022-01-08 DIAGNOSIS — G8929 Other chronic pain: Secondary | ICD-10-CM | POA: Diagnosis not present

## 2022-01-08 DIAGNOSIS — N1832 Chronic kidney disease, stage 3b: Secondary | ICD-10-CM | POA: Diagnosis not present

## 2022-01-08 HISTORY — DX: Radiculopathy, lumbar region: M54.16

## 2022-01-08 HISTORY — DX: Postlaminectomy syndrome, not elsewhere classified: M96.1

## 2022-01-22 ENCOUNTER — Other Ambulatory Visit: Payer: Self-pay

## 2022-01-22 ENCOUNTER — Ambulatory Visit: Payer: PPO | Admitting: Cardiology

## 2022-01-22 ENCOUNTER — Encounter: Payer: Self-pay | Admitting: Cardiology

## 2022-01-22 ENCOUNTER — Ambulatory Visit (INDEPENDENT_AMBULATORY_CARE_PROVIDER_SITE_OTHER): Payer: PPO | Admitting: Cardiology

## 2022-01-22 VITALS — BP 130/74 | HR 80 | Ht 64.0 in | Wt 229.8 lb

## 2022-01-22 DIAGNOSIS — I4892 Unspecified atrial flutter: Secondary | ICD-10-CM

## 2022-01-22 DIAGNOSIS — Z713 Dietary counseling and surveillance: Secondary | ICD-10-CM

## 2022-01-22 DIAGNOSIS — E669 Obesity, unspecified: Secondary | ICD-10-CM | POA: Diagnosis not present

## 2022-01-22 DIAGNOSIS — I10 Essential (primary) hypertension: Secondary | ICD-10-CM | POA: Diagnosis not present

## 2022-01-22 DIAGNOSIS — E088 Diabetes mellitus due to underlying condition with unspecified complications: Secondary | ICD-10-CM

## 2022-01-22 HISTORY — DX: Dietary counseling and surveillance: Z71.3

## 2022-01-22 HISTORY — DX: Unspecified atrial flutter: I48.92

## 2022-01-22 MED ORDER — DILTIAZEM HCL ER COATED BEADS 240 MG PO CP24: 240.0000 mg | ORAL_CAPSULE | Freq: Every day | ORAL | 3 refills | Status: DC

## 2022-01-22 MED ORDER — ATORVASTATIN CALCIUM 10 MG PO TABS: 10.0000 mg | ORAL_TABLET | Freq: Every day | ORAL | 3 refills | Status: DC

## 2022-01-22 MED ORDER — APIXABAN 5 MG PO TABS: 5.0000 mg | ORAL_TABLET | Freq: Two times a day (BID) | ORAL | 5 refills | Status: DC

## 2022-01-22 NOTE — Patient Instructions (Signed)
Medication Instructions:  Your physician has recommended you make the following change in your medication:   START: Lipitor 10mg  1 tablet daily by mouth  INCREASE: Cardizem to 240mg  daily by mouth   Lab Work: None Ordered If you have labs (blood work) drawn today and your tests are completely normal, you will receive your results only by: MyChart Message (if you have MyChart) OR A paper copy in the mail If you have any lab test that is abnormal or we need to change your treatment, we will call you to review the results.   Testing/Procedures: None Ordered   Follow-Up: At [facility], you and your health needs are our priority.  As part of our continuing mission to provide you with exceptional heart care, we have created designated Provider Care Teams.  These Care Teams include your primary Cardiologist (physician) and Advanced Practice Providers (APPs -  Physician Assistants and Nurse Practitioners) who all work together to provide you with the care you need, when you need it.  We recommend signing up for the patient portal called "MyChart".  Sign up information is provided on this After Visit Summary.  MyChart is used to connect with patients for Virtual Visits (Telemedicine).  Patients are able to view lab/test results, encounter notes, upcoming appointments, etc.  Non-urgent messages can be sent to your provider as well.   To learn more about what you can do with MyChart, go to .    Your next appointment:   3 month(s)  The format for your next appointment:   In Person  Provider:   [facility], MD    Other Instructions Referral to [email or web address] They will call for appt
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
## 2022-01-24 DIAGNOSIS — M5416 Radiculopathy, lumbar region: Secondary | ICD-10-CM | POA: Diagnosis not present

## 2022-01-25 DIAGNOSIS — H1045 Other chronic allergic conjunctivitis: Secondary | ICD-10-CM | POA: Diagnosis not present

## 2022-01-25 DIAGNOSIS — H18893 Other specified disorders of cornea, bilateral: Secondary | ICD-10-CM | POA: Diagnosis not present

## 2022-01-30 DIAGNOSIS — H1045 Other chronic allergic conjunctivitis: Secondary | ICD-10-CM | POA: Diagnosis not present

## 2022-01-30 DIAGNOSIS — H18893 Other specified disorders of cornea, bilateral: Secondary | ICD-10-CM | POA: Diagnosis not present

## 2022-02-01 DIAGNOSIS — D649 Anemia, unspecified: Secondary | ICD-10-CM | POA: Diagnosis not present

## 2022-02-01 DIAGNOSIS — J309 Allergic rhinitis, unspecified: Secondary | ICD-10-CM | POA: Diagnosis not present

## 2022-02-01 DIAGNOSIS — M159 Polyosteoarthritis, unspecified: Secondary | ICD-10-CM | POA: Diagnosis not present

## 2022-02-01 DIAGNOSIS — E1169 Type 2 diabetes mellitus with other specified complication: Secondary | ICD-10-CM | POA: Diagnosis not present

## 2022-02-01 DIAGNOSIS — F419 Anxiety disorder, unspecified: Secondary | ICD-10-CM | POA: Diagnosis not present

## 2022-02-01 DIAGNOSIS — R5382 Chronic fatigue, unspecified: Secondary | ICD-10-CM | POA: Diagnosis not present

## 2022-02-01 DIAGNOSIS — I4891 Unspecified atrial fibrillation: Secondary | ICD-10-CM | POA: Diagnosis not present

## 2022-02-01 DIAGNOSIS — J449 Chronic obstructive pulmonary disease, unspecified: Secondary | ICD-10-CM | POA: Diagnosis not present

## 2022-02-01 DIAGNOSIS — N1832 Chronic kidney disease, stage 3b: Secondary | ICD-10-CM | POA: Diagnosis not present

## 2022-02-01 DIAGNOSIS — M25551 Pain in right hip: Secondary | ICD-10-CM | POA: Diagnosis not present

## 2022-02-01 DIAGNOSIS — I10 Essential (primary) hypertension: Secondary | ICD-10-CM | POA: Diagnosis not present

## 2022-02-13 DIAGNOSIS — H1045 Other chronic allergic conjunctivitis: Secondary | ICD-10-CM | POA: Diagnosis not present

## 2022-02-15 DIAGNOSIS — F419 Anxiety disorder, unspecified: Secondary | ICD-10-CM | POA: Diagnosis not present

## 2022-02-15 DIAGNOSIS — M159 Polyosteoarthritis, unspecified: Secondary | ICD-10-CM | POA: Diagnosis not present

## 2022-02-15 DIAGNOSIS — E1169 Type 2 diabetes mellitus with other specified complication: Secondary | ICD-10-CM | POA: Diagnosis not present

## 2022-02-15 DIAGNOSIS — J449 Chronic obstructive pulmonary disease, unspecified: Secondary | ICD-10-CM | POA: Diagnosis not present

## 2022-02-15 DIAGNOSIS — I10 Essential (primary) hypertension: Secondary | ICD-10-CM | POA: Diagnosis not present

## 2022-02-15 DIAGNOSIS — M25552 Pain in left hip: Secondary | ICD-10-CM | POA: Diagnosis not present

## 2022-02-15 DIAGNOSIS — D649 Anemia, unspecified: Secondary | ICD-10-CM | POA: Diagnosis not present

## 2022-02-15 DIAGNOSIS — J309 Allergic rhinitis, unspecified: Secondary | ICD-10-CM | POA: Diagnosis not present

## 2022-02-15 DIAGNOSIS — K580 Irritable bowel syndrome with diarrhea: Secondary | ICD-10-CM | POA: Diagnosis not present

## 2022-02-15 DIAGNOSIS — N1832 Chronic kidney disease, stage 3b: Secondary | ICD-10-CM | POA: Diagnosis not present

## 2022-02-15 DIAGNOSIS — I4891 Unspecified atrial fibrillation: Secondary | ICD-10-CM | POA: Diagnosis not present

## 2022-02-20 ENCOUNTER — Ambulatory Visit: Payer: Managed Care, Other (non HMO) | Admitting: Cardiology

## 2022-02-21 ENCOUNTER — Other Ambulatory Visit: Payer: Self-pay | Admitting: Internal Medicine

## 2022-02-21 DIAGNOSIS — Z1231 Encounter for screening mammogram for malignant neoplasm of breast: Secondary | ICD-10-CM

## 2022-03-04 DIAGNOSIS — I10 Essential (primary) hypertension: Secondary | ICD-10-CM | POA: Diagnosis not present

## 2022-03-04 DIAGNOSIS — K580 Irritable bowel syndrome with diarrhea: Secondary | ICD-10-CM | POA: Diagnosis not present

## 2022-03-04 DIAGNOSIS — J449 Chronic obstructive pulmonary disease, unspecified: Secondary | ICD-10-CM | POA: Diagnosis not present

## 2022-03-04 DIAGNOSIS — I4891 Unspecified atrial fibrillation: Secondary | ICD-10-CM | POA: Diagnosis not present

## 2022-03-04 DIAGNOSIS — D649 Anemia, unspecified: Secondary | ICD-10-CM | POA: Diagnosis not present

## 2022-03-04 DIAGNOSIS — E1169 Type 2 diabetes mellitus with other specified complication: Secondary | ICD-10-CM | POA: Diagnosis not present

## 2022-03-04 DIAGNOSIS — N1832 Chronic kidney disease, stage 3b: Secondary | ICD-10-CM | POA: Diagnosis not present

## 2022-03-04 DIAGNOSIS — J309 Allergic rhinitis, unspecified: Secondary | ICD-10-CM | POA: Diagnosis not present

## 2022-03-04 DIAGNOSIS — F419 Anxiety disorder, unspecified: Secondary | ICD-10-CM | POA: Diagnosis not present

## 2022-03-04 DIAGNOSIS — Z23 Encounter for immunization: Secondary | ICD-10-CM | POA: Diagnosis not present

## 2022-03-04 DIAGNOSIS — M159 Polyosteoarthritis, unspecified: Secondary | ICD-10-CM | POA: Diagnosis not present

## 2022-03-19 DIAGNOSIS — I4891 Unspecified atrial fibrillation: Secondary | ICD-10-CM | POA: Diagnosis not present

## 2022-03-19 DIAGNOSIS — N1832 Chronic kidney disease, stage 3b: Secondary | ICD-10-CM | POA: Diagnosis not present

## 2022-03-19 DIAGNOSIS — J309 Allergic rhinitis, unspecified: Secondary | ICD-10-CM | POA: Diagnosis not present

## 2022-03-19 DIAGNOSIS — E1169 Type 2 diabetes mellitus with other specified complication: Secondary | ICD-10-CM | POA: Diagnosis not present

## 2022-03-19 DIAGNOSIS — J449 Chronic obstructive pulmonary disease, unspecified: Secondary | ICD-10-CM | POA: Diagnosis not present

## 2022-03-19 DIAGNOSIS — M159 Polyosteoarthritis, unspecified: Secondary | ICD-10-CM | POA: Diagnosis not present

## 2022-03-19 DIAGNOSIS — I10 Essential (primary) hypertension: Secondary | ICD-10-CM | POA: Diagnosis not present

## 2022-03-19 DIAGNOSIS — D649 Anemia, unspecified: Secondary | ICD-10-CM | POA: Diagnosis not present

## 2022-03-19 DIAGNOSIS — F419 Anxiety disorder, unspecified: Secondary | ICD-10-CM | POA: Diagnosis not present

## 2022-03-19 DIAGNOSIS — M25552 Pain in left hip: Secondary | ICD-10-CM | POA: Diagnosis not present

## 2022-03-19 DIAGNOSIS — J0100 Acute maxillary sinusitis, unspecified: Secondary | ICD-10-CM | POA: Diagnosis not present

## 2022-03-20 ENCOUNTER — Ambulatory Visit
Admission: RE | Admit: 2022-03-20 | Discharge: 2022-03-20 | Disposition: A | Discharge status: Home / Self Care | Payer: PPO | Source: Ambulatory Visit | Attending: Internal Medicine | Admitting: Internal Medicine

## 2022-03-20 DIAGNOSIS — Z1231 Encounter for screening mammogram for malignant neoplasm of breast: Secondary | ICD-10-CM

## 2022-03-30 DIAGNOSIS — Z96651 Presence of right artificial knee joint: Secondary | ICD-10-CM | POA: Diagnosis not present

## 2022-03-30 DIAGNOSIS — S61511A Laceration without foreign body of right wrist, initial encounter: Secondary | ICD-10-CM | POA: Diagnosis not present

## 2022-03-30 DIAGNOSIS — M25571 Pain in right ankle and joints of right foot: Secondary | ICD-10-CM | POA: Diagnosis not present

## 2022-03-30 DIAGNOSIS — S51811A Laceration without foreign body of right forearm, initial encounter: Secondary | ICD-10-CM | POA: Diagnosis not present

## 2022-03-30 DIAGNOSIS — M79604 Pain in right leg: Secondary | ICD-10-CM | POA: Diagnosis not present

## 2022-03-30 DIAGNOSIS — M84431A Pathological fracture, right ulna, initial encounter for fracture: Secondary | ICD-10-CM | POA: Diagnosis not present

## 2022-03-30 DIAGNOSIS — S8991XA Unspecified injury of right lower leg, initial encounter: Secondary | ICD-10-CM | POA: Diagnosis not present

## 2022-03-30 DIAGNOSIS — M25551 Pain in right hip: Secondary | ICD-10-CM | POA: Diagnosis not present

## 2022-03-30 DIAGNOSIS — S99911A Unspecified injury of right ankle, initial encounter: Secondary | ICD-10-CM | POA: Diagnosis not present

## 2022-04-01 DIAGNOSIS — L0390 Cellulitis, unspecified: Secondary | ICD-10-CM | POA: Diagnosis not present

## 2022-04-01 DIAGNOSIS — I4891 Unspecified atrial fibrillation: Secondary | ICD-10-CM | POA: Diagnosis not present

## 2022-04-01 DIAGNOSIS — N1832 Chronic kidney disease, stage 3b: Secondary | ICD-10-CM | POA: Diagnosis not present

## 2022-04-01 DIAGNOSIS — K580 Irritable bowel syndrome with diarrhea: Secondary | ICD-10-CM | POA: Diagnosis not present

## 2022-04-01 DIAGNOSIS — D649 Anemia, unspecified: Secondary | ICD-10-CM | POA: Diagnosis not present

## 2022-04-01 DIAGNOSIS — I10 Essential (primary) hypertension: Secondary | ICD-10-CM | POA: Diagnosis not present

## 2022-04-01 DIAGNOSIS — M159 Polyosteoarthritis, unspecified: Secondary | ICD-10-CM | POA: Diagnosis not present

## 2022-04-01 DIAGNOSIS — E1169 Type 2 diabetes mellitus with other specified complication: Secondary | ICD-10-CM | POA: Diagnosis not present

## 2022-04-01 DIAGNOSIS — J449 Chronic obstructive pulmonary disease, unspecified: Secondary | ICD-10-CM | POA: Diagnosis not present

## 2022-04-01 DIAGNOSIS — J309 Allergic rhinitis, unspecified: Secondary | ICD-10-CM | POA: Diagnosis not present

## 2022-04-01 DIAGNOSIS — F419 Anxiety disorder, unspecified: Secondary | ICD-10-CM | POA: Diagnosis not present

## 2022-04-05 DIAGNOSIS — M25561 Pain in right knee: Secondary | ICD-10-CM | POA: Diagnosis not present

## 2022-04-08 ENCOUNTER — Ambulatory Visit: Payer: PPO | Attending: Cardiology | Admitting: Cardiology

## 2022-04-08 ENCOUNTER — Encounter: Payer: Self-pay | Admitting: Cardiology

## 2022-04-08 VITALS — BP 128/52 | HR 85 | Ht 64.0 in | Wt 230.0 lb

## 2022-04-08 DIAGNOSIS — D6869 Other thrombophilia: Secondary | ICD-10-CM

## 2022-04-08 DIAGNOSIS — I4892 Unspecified atrial flutter: Secondary | ICD-10-CM

## 2022-04-08 MED ORDER — FLECAINIDE ACETATE 50 MG PO TABS: 50.0000 mg | ORAL_TABLET | Freq: Two times a day (BID) | ORAL | 3 refills | Status: DC
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
## 2022-04-08 NOTE — Patient Instructions (Signed)
Medication Instructions:  Your physician has recommended you make the following change in your medication:  START Flecainide 50 mg twice daily  *If you need a refill on your cardiac medications before your next appointment, please call your pharmacy*   Lab Work: None ordered   Testing/Procedures: None ordered   Follow-Up: At [facility] you and your health needs are our priority.  As part of our continuing mission to provide you with exceptional heart care, we have created designated Provider Care Teams.  These Care Teams include your primary Cardiologist (physician) and Advanced Practice Providers (APPs -  Physician Assistants and Nurse Practitioners) who all work together to provide you with the care you need, when you need it.   Your physician recommends that you schedule a follow-up appointment in: 2 weeks for nurse visit EKG after Flecainide start  Your next appointment:   3 month(s)  The format for your next appointment:   In Person  Provider:   Allegra Lai, MD    Thank you for choosing [facility]!!   Trinidad Curet, RN [phone]  Other Instructions   Flecainide Tablets What is this medication? FLECAINIDE (FLEK a nide) prevents and treats a fast or irregular heartbeat (arrhythmia). It is often used to treat a type of arrhythmia known as AFib (atrial fibrillation). It works by slowing down overactive electric signals in the heart, which stabilizes your heart rhythm. It belongs to a group of medications called antiarrhythmics. This medicine may be used for other purposes; ask your health care provider or pharmacist if you have questions. COMMON BRAND NAME(S): Tambocor What should I tell my care team before I take this medication? They need to know if you have any of these conditions: High or low levels of potassium in the blood Heart disease including heart rhythm and heart rate problems Kidney disease Liver disease Recent heart attack An unusual or  allergic reaction to flecainide, other medications, foods, dyes, or preservatives Pregnant or trying to get pregnant Breastfeeding How should I use this medication? Take this medication by mouth with a glass of water. Take it as directed on the prescription label at the same time every day. You can take it with or without food. If it upsets your stomach, take it with food. Do not take your medication more often than directed. Do not stop taking this medication suddenly. This may cause serious, heart-related side effects. If your care team wants you to stop the medication, the dose may be slowly lowered over time to avoid any side effects. Talk to your care team about the use of this medication in children. While it may be prescribed for children as young as 1 year for selected conditions, precautions do apply. Overdosage: If you think you have taken too much of this medicine contact a poison control center or emergency room at once. NOTE: This medicine is only for you. Do not share this medicine with others. What if I miss a dose? If you miss a dose, take it as soon as you can. If it is almost time for your next dose, take only that dose. Do not take double or extra doses. What may interact with this medication? Do not take this medication with any of the following: Amoxapine Arsenic trioxide Certain antibiotics, such as clarithromycin, erythromycin, gatifloxacin, gemifloxacin, levofloxacin, moxifloxacin, sparfloxacin, or troleandomycin Certain antidepressants, called tricyclic antidepressants such as amitriptyline, imipramine, or nortriptyline Certain medications for irregular heartbeat, such as disopyramide, encainide, moricizine, procainamide, propafenone, and quinidine Cisapride Delavirdine Droperidol  Haloperidol Hawthorn Imatinib Levomethadyl Maprotiline Medications for malaria, such as chloroquine and halofantrine Pentamidine Phenothiazines, such as chlorpromazine, mesoridazine,  prochlorperazine, thioridazine Pimozide Quinine Ranolazine Ritonavir Sertindole This medication may also interact with the following: Cimetidine Dofetilide Medications for angina or blood pressure Medications for irregular heartbeat, such as amiodarone and digoxin Ziprasidone This list may not describe all possible interactions. Give your health care provider a list of all the medicines, herbs, non-prescription drugs, or dietary supplements you use. Also tell them if you smoke, drink alcohol, or use illegal drugs. Some items may interact with your medicine. What should I watch for while using this medication? Visit your care team for regular checks on your progress. Because your condition and the use of this medication carries some risk, it is a good idea to carry an identification card, necklace, or bracelet with details of your condition, medications, and care team. Check your blood pressure and pulse rate as directed. Know what your blood pressure and pulse rate should be and when tod contact your care team. Your care team may schedule regular blood tests and electrocardiograms to check your progress. This medication may affect your coordination, reaction time, or judgment. Do not drive or operate machinery until you know how this medication affects you. Sit up or stand slowly to reduce the risk of dizzy or fainting spells. Drinking alcohol with this medication can increase the risk of these side effects. What side effects may I notice from receiving this medication? Side effects that you should report to your care team as soon as possible: Allergic reactions--skin rash, itching, hives, swelling of the face, lips, tongue, or throat Heart failure--shortness of breath, swelling of the ankles, feet, or hands, sudden weight gain, unusual weakness or fatigue Heart rhythm changes--fast or irregular heartbeat, dizziness, feeling faint or lightheaded, chest pain, trouble breathing Liver  injury--right upper belly pain, loss of appetite, nausea, light-colored stool, dark yellow or brown urine, yellowing skin or eyes, unusual weakness or fatigue Side effects that usually do not require medical attention (report to your care team if they continue or are bothersome): Blurry vision Constipation Dizziness Fatigue Headache Nausea Tremors or shaking This list may not describe all possible side effects. Call your doctor for medical advice about side effects. You may report side effects to FDA at 1-800-FDA-1088. Where should I keep my medication? Keep out of the reach of children and pets. Store at room temperature between 15 and 30 degrees C (59 and 86 degrees F). Protect from light. Keep container tightly closed. Throw away any unused medication after the expiration date. NOTE: This sheet is a summary. It may not cover all possible information. If you have questions about this medicine, talk to your doctor, pharmacist, or health care provider.  2023 Elsevier/Gold Standard (2004-08-03 00:00:00)  Important Information About Sugar          Flecainide Tablets What is this medication? FLECAINIDE (FLEK a nide) prevents and treats a fast or irregular heartbeat (arrhythmia). It is often used to treat a type of arrhythmia known as AFib (atrial fibrillation). It works by slowing down overactive electric signals in the heart, which stabilizes your heart rhythm. It belongs to a group of medications called antiarrhythmics. This medicine may be used for other purposes; ask your health care provider or pharmacist if you have questions. COMMON BRAND NAME(S): Tambocor What should I tell my care team before I take this medication? They need to know if you have any of these conditions: High or low levels of  potassium in the blood Heart disease including heart rhythm and heart rate problems Kidney disease Liver disease Recent heart attack An unusual or allergic reaction to flecainide,  other medications, foods, dyes, or preservatives Pregnant or trying to get pregnant Breastfeeding How should I use this medication? Take this medication by mouth with a glass of water. Take it as directed on the prescription label at the same time every day. You can take it with or without food. If it upsets your stomach, take it with food. Do not take your medication more often than directed. Do not stop taking this medication suddenly. This may cause serious, heart-related side effects. If your care team wants you to stop the medication, the dose may be slowly lowered over time to avoid any side effects. Talk to your care team about the use of this medication in children. While it may be prescribed for children as young as 1 year for selected conditions, precautions do apply. Overdosage: If you think you have taken too much of this medicine contact a poison control center or emergency room at once. NOTE: This medicine is only for you. Do not share this medicine with others. What if I miss a dose? If you miss a dose, take it as soon as you can. If it is almost time for your next dose, take only that dose. Do not take double or extra doses. What may interact with this medication? Do not take this medication with any of the following: Amoxapine Arsenic trioxide Certain antibiotics, such as clarithromycin, erythromycin, gatifloxacin, gemifloxacin, levofloxacin, moxifloxacin, sparfloxacin, or troleandomycin Certain antidepressants, called tricyclic antidepressants such as amitriptyline, imipramine, or nortriptyline Certain medications for irregular heartbeat, such as disopyramide, encainide, moricizine, procainamide, propafenone, and quinidine Cisapride Delavirdine Droperidol Haloperidol Hawthorn Imatinib Levomethadyl Maprotiline Medications for malaria, such as chloroquine and halofantrine Pentamidine Phenothiazines, such as chlorpromazine, mesoridazine, prochlorperazine,  thioridazine Pimozide Quinine Ranolazine Ritonavir Sertindole This medication may also interact with the following: Cimetidine Dofetilide Medications for angina or blood pressure Medications for irregular heartbeat, such as amiodarone and digoxin Ziprasidone This list may not describe all possible interactions. Give your health care provider a list of all the medicines, herbs, non-prescription drugs, or dietary supplements you use. Also tell them if you smoke, drink alcohol, or use illegal drugs. Some items may interact with your medicine. What should I watch for while using this medication? Visit your care team for regular checks on your progress. Because your condition and the use of this medication carries some risk, it is a good idea to carry an identification card, necklace, or bracelet with details of your condition, medications, and care team. Check your blood pressure and pulse rate as directed. Know what your blood pressure and pulse rate should be and when tod contact your care team. Your care team may schedule regular blood tests and electrocardiograms to check your progress. This medication may affect your coordination, reaction time, or judgment. Do not drive or operate machinery until you know how this medication affects you. Sit up or stand slowly to reduce the risk of dizzy or fainting spells. Drinking alcohol with this medication can increase the risk of these side effects. What side effects may I notice from receiving this medication? Side effects that you should report to your care team as soon as possible: Allergic reactions--skin rash, itching, hives, swelling of the face, lips, tongue, or throat Heart failure--shortness of breath, swelling of the ankles, feet, or hands, sudden weight gain, unusual weakness or fatigue Heart rhythm changes--fast  or irregular heartbeat, dizziness, feeling faint or lightheaded, chest pain, trouble breathing Liver injury--right upper belly  pain, loss of appetite, nausea, light-colored stool, dark yellow or brown urine, yellowing skin or eyes, unusual weakness or fatigue Side effects that usually do not require medical attention (report to your care team if they continue or are bothersome): Blurry vision Constipation Dizziness Fatigue Headache Nausea Tremors or shaking This list may not describe all possible side effects. Call your doctor for medical advice about side effects. You may report side effects to FDA at 1-800-FDA-1088. Where should I keep my medication? Keep out of the reach of children and pets. Store at room temperature between 15 and 30 degrees C (59 and 86 degrees F). Protect from light. Keep container tightly closed. Throw away any unused medication after the expiration date. NOTE: This sheet is a summary. It may not cover all possible information. If you have questions about this medicine, talk to your doctor, pharmacist, or health care provider.  2023 Elsevier/Gold Standard (2004-08-03 00:00:00)

## 2022-04-15 DIAGNOSIS — I10 Essential (primary) hypertension: Secondary | ICD-10-CM | POA: Diagnosis not present

## 2022-04-15 DIAGNOSIS — I4891 Unspecified atrial fibrillation: Secondary | ICD-10-CM | POA: Diagnosis not present

## 2022-04-15 DIAGNOSIS — J449 Chronic obstructive pulmonary disease, unspecified: Secondary | ICD-10-CM | POA: Diagnosis not present

## 2022-04-15 DIAGNOSIS — M159 Polyosteoarthritis, unspecified: Secondary | ICD-10-CM | POA: Diagnosis not present

## 2022-04-15 DIAGNOSIS — J309 Allergic rhinitis, unspecified: Secondary | ICD-10-CM | POA: Diagnosis not present

## 2022-04-15 DIAGNOSIS — N1832 Chronic kidney disease, stage 3b: Secondary | ICD-10-CM | POA: Diagnosis not present

## 2022-04-15 DIAGNOSIS — L0390 Cellulitis, unspecified: Secondary | ICD-10-CM | POA: Diagnosis not present

## 2022-04-15 DIAGNOSIS — E1169 Type 2 diabetes mellitus with other specified complication: Secondary | ICD-10-CM | POA: Diagnosis not present

## 2022-04-15 DIAGNOSIS — K580 Irritable bowel syndrome with diarrhea: Secondary | ICD-10-CM | POA: Diagnosis not present

## 2022-04-15 DIAGNOSIS — D649 Anemia, unspecified: Secondary | ICD-10-CM | POA: Diagnosis not present

## 2022-04-15 DIAGNOSIS — F419 Anxiety disorder, unspecified: Secondary | ICD-10-CM | POA: Diagnosis not present

## 2022-04-19 ENCOUNTER — Ambulatory Visit: Payer: PPO | Attending: Cardiology

## 2022-04-19 VITALS — BP 140/70 | HR 71 | Ht 64.0 in | Wt 232.0 lb

## 2022-04-19 DIAGNOSIS — M25561 Pain in right knee: Secondary | ICD-10-CM | POA: Insufficient documentation

## 2022-04-19 DIAGNOSIS — Z96651 Presence of right artificial knee joint: Secondary | ICD-10-CM | POA: Diagnosis not present

## 2022-04-19 DIAGNOSIS — I4892 Unspecified atrial flutter: Secondary | ICD-10-CM | POA: Diagnosis not present

## 2022-04-19 HISTORY — DX: Pain in right knee: M25.561

## 2022-04-19 NOTE — Progress Notes (Signed)
   Nurse Visit   Date of Encounter: 04/19/2022 ID: Hailey Stanton, DOB 04-Jan-1956, MRN [id number]  PCP:  Simone Curia, MD   [facility] Providers Cardiologist:  None Electrophysiologist:  Will Jorja Loa, MD      Visit Details   VS:  BP (!) 140/70 (BP Location: Right Arm, Patient Position: Sitting, Cuff Size: Normal)   Pulse 71   Ht 5\' 4"  (1.626 m)   Wt 232 lb (105.2 kg)   SpO2 97%   BMI 39.82 kg/m  , BMI Body mass index is 39.82 kg/m.  Wt Readings from Last 3 Encounters:  04/19/22 232 lb (105.2 kg)  04/08/22 230 lb (104.3 kg)  01/22/22 229 lb 12.8 oz (104.2 kg)     Reason for visit: Follow up Flecainide On 50mg  BID. Performed today: Vitals, EKG. Dr 01/24/22 reviewed EKG.  Changes (medications, testing, etc.) : No Changes - Continue present dose. Follow up as scheduled Length of Visit: 15 minutes    Medications Adjustments/Labs and Tests Ordered: No orders of the defined types were placed in this encounter.  No orders of the defined types were placed in this encounter.    Signed, , RN  04/19/2022 9:18 AM

## 2022-05-02 DIAGNOSIS — M79644 Pain in right finger(s): Secondary | ICD-10-CM

## 2022-05-02 HISTORY — DX: Pain in right finger(s): M79.644

## 2022-05-03 ENCOUNTER — Encounter: Payer: Self-pay | Admitting: Cardiology

## 2022-05-03 ENCOUNTER — Ambulatory Visit: Payer: PPO | Attending: Cardiology | Admitting: Cardiology

## 2022-05-03 VITALS — BP 130/64 | HR 88 | Ht 64.0 in | Wt 236.8 lb

## 2022-05-03 DIAGNOSIS — E782 Mixed hyperlipidemia: Secondary | ICD-10-CM | POA: Diagnosis not present

## 2022-05-03 DIAGNOSIS — I4892 Unspecified atrial flutter: Secondary | ICD-10-CM

## 2022-05-03 DIAGNOSIS — E088 Diabetes mellitus due to underlying condition with unspecified complications: Secondary | ICD-10-CM | POA: Diagnosis not present

## 2022-05-03 DIAGNOSIS — I10 Essential (primary) hypertension: Secondary | ICD-10-CM | POA: Diagnosis not present

## 2022-05-03 MED ORDER — FLECAINIDE ACETATE 50 MG PO TABS: 75.0000 mg | ORAL_TABLET | Freq: Two times a day (BID) | ORAL | 2 refills | Status: DC

## 2022-05-03 NOTE — Addendum Note (Signed)
Addended by: Heywood Bene on: 05/03/2022 09:42 AM   Modules accepted: Orders
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
## 2022-05-03 NOTE — Patient Instructions (Signed)
Medication Instructions:  Your physician has recommended you make the following change in your medication: Increasing Flecainide to one and a half tablet (75mg ) twice daily. *If you need a refill on your cardiac medications before your next appointment, please call your pharmacy*   Lab Work: Your physician recommends that you return for lab work in: Please come back next week fasting for Lipid Profile, AST and ALT. If you have labs (blood work) drawn today and your tests are completely normal, you will receive your results only by: MyChart Message (if you have MyChart) OR A paper copy in the mail If you have any lab test that is abnormal or we need to change your treatment, we will call you to review the results.   Testing/Procedures: Please come back next week to repeat EKG.   Follow-Up: At [facility], you and your health needs are our priority.  As part of our continuing mission to provide you with exceptional heart care, we have created designated Provider Care Teams.  These Care Teams include your primary Cardiologist (physician) and Advanced Practice Providers (APPs -  Physician Assistants and Nurse Practitioners) who all work together to provide you with the care you need, when you need it.  We recommend signing up for the patient portal called "MyChart".  Sign up information is provided on this After Visit Summary.  MyChart is used to connect with patients for Virtual Visits (Telemedicine).  Patients are able to view lab/test results, encounter notes, upcoming appointments, etc.  Non-urgent messages can be sent to your provider as well.   To learn more about what you can do with MyChart, go to [facility].    Your next appointment:   6 week(s)  The format for your next appointment:   In Person  Provider:   [email or web address], MD    Other Instructions

## 2022-05-04 DIAGNOSIS — M159 Polyosteoarthritis, unspecified: Secondary | ICD-10-CM | POA: Diagnosis not present

## 2022-05-04 DIAGNOSIS — J449 Chronic obstructive pulmonary disease, unspecified: Secondary | ICD-10-CM | POA: Diagnosis not present

## 2022-05-04 DIAGNOSIS — K580 Irritable bowel syndrome with diarrhea: Secondary | ICD-10-CM | POA: Diagnosis not present

## 2022-05-04 DIAGNOSIS — D649 Anemia, unspecified: Secondary | ICD-10-CM | POA: Diagnosis not present

## 2022-05-04 DIAGNOSIS — E1169 Type 2 diabetes mellitus with other specified complication: Secondary | ICD-10-CM | POA: Diagnosis not present

## 2022-05-04 DIAGNOSIS — I10 Essential (primary) hypertension: Secondary | ICD-10-CM | POA: Diagnosis not present

## 2022-05-04 DIAGNOSIS — L0390 Cellulitis, unspecified: Secondary | ICD-10-CM | POA: Diagnosis not present

## 2022-05-04 DIAGNOSIS — N1832 Chronic kidney disease, stage 3b: Secondary | ICD-10-CM | POA: Diagnosis not present

## 2022-05-04 DIAGNOSIS — I4891 Unspecified atrial fibrillation: Secondary | ICD-10-CM | POA: Diagnosis not present

## 2022-05-04 DIAGNOSIS — F419 Anxiety disorder, unspecified: Secondary | ICD-10-CM | POA: Diagnosis not present

## 2022-05-04 DIAGNOSIS — J309 Allergic rhinitis, unspecified: Secondary | ICD-10-CM | POA: Diagnosis not present

## 2022-05-07 DIAGNOSIS — H524 Presbyopia: Secondary | ICD-10-CM | POA: Diagnosis not present

## 2022-05-07 DIAGNOSIS — E119 Type 2 diabetes mellitus without complications: Secondary | ICD-10-CM | POA: Diagnosis not present

## 2022-05-08 DIAGNOSIS — F419 Anxiety disorder, unspecified: Secondary | ICD-10-CM | POA: Diagnosis not present

## 2022-05-08 DIAGNOSIS — M25551 Pain in right hip: Secondary | ICD-10-CM | POA: Diagnosis not present

## 2022-05-08 DIAGNOSIS — D649 Anemia, unspecified: Secondary | ICD-10-CM | POA: Diagnosis not present

## 2022-05-08 DIAGNOSIS — L0390 Cellulitis, unspecified: Secondary | ICD-10-CM | POA: Diagnosis not present

## 2022-05-08 DIAGNOSIS — I4891 Unspecified atrial fibrillation: Secondary | ICD-10-CM | POA: Diagnosis not present

## 2022-05-08 DIAGNOSIS — J309 Allergic rhinitis, unspecified: Secondary | ICD-10-CM | POA: Diagnosis not present

## 2022-05-08 DIAGNOSIS — I10 Essential (primary) hypertension: Secondary | ICD-10-CM | POA: Diagnosis not present

## 2022-05-08 DIAGNOSIS — N1832 Chronic kidney disease, stage 3b: Secondary | ICD-10-CM | POA: Diagnosis not present

## 2022-05-08 DIAGNOSIS — E1169 Type 2 diabetes mellitus with other specified complication: Secondary | ICD-10-CM | POA: Diagnosis not present

## 2022-05-08 DIAGNOSIS — M159 Polyosteoarthritis, unspecified: Secondary | ICD-10-CM | POA: Diagnosis not present

## 2022-05-08 DIAGNOSIS — J449 Chronic obstructive pulmonary disease, unspecified: Secondary | ICD-10-CM | POA: Diagnosis not present

## 2022-05-09 DIAGNOSIS — S5331XA Traumatic rupture of right ulnar collateral ligament, initial encounter: Secondary | ICD-10-CM | POA: Diagnosis not present

## 2022-05-09 DIAGNOSIS — M79644 Pain in right finger(s): Secondary | ICD-10-CM | POA: Diagnosis not present

## 2022-05-10 ENCOUNTER — Telehealth: Payer: Self-pay | Admitting: Cardiology

## 2022-05-10 ENCOUNTER — Encounter: Payer: Self-pay | Admitting: Cardiology

## 2022-05-10 ENCOUNTER — Ambulatory Visit: Payer: PPO | Attending: Cardiology

## 2022-05-10 VITALS — BP 126/60 | HR 90 | Ht 64.0 in | Wt 237.2 lb

## 2022-05-10 DIAGNOSIS — I10 Essential (primary) hypertension: Secondary | ICD-10-CM | POA: Diagnosis not present

## 2022-05-10 DIAGNOSIS — I4892 Unspecified atrial flutter: Secondary | ICD-10-CM

## 2022-05-10 NOTE — Telephone Encounter (Signed)
   Pre-operative Risk Assessment    Patient Name: Hailey Stanton  DOB: Aug 29, 1955 MRN: [id number]      Request for Surgical Clearance    Procedure:   Right Thumb Ulnar Collateral Ligament Repair Metacarpal Phalangeal Joint  Date of Surgery:  Clearance 05/13/22                                 Surgeon:  Dr. Bradly Bienenstock Surgeon's Group or Practice Name:  [facility] Phone number:  [phone] Fax number:  [phone]   Type of Clearance Requested:   - Medical    Type of Anesthesia:   Regional    Additional requests/questions:    Minna Antis   05/10/2022, 2:45 PM

## 2022-05-10 NOTE — Telephone Encounter (Signed)
   Patient Name: Hailey Stanton  DOB: [birth year] MRN: [id number]  Primary Cardiologist: None  Chart reviewed as part of pre-operative protocol coverage. Given past medical history and time since last visit, based on ACC/AHA guidelines, Hailey Stanton is at acceptable risk for the planned procedure without further cardiovascular testing.   The patient was advised that if she develops new symptoms prior to surgery to contact our office to arrange for a follow-up visit, and she verbalized understanding.  Per office protocol, patient can hold Eliquis for 1-2 days prior to procedure.  Please resume Eliquis as soon as possible postprocedure, at the discretion of the surgeon.   I will route this recommendation to the requesting party via Epic fax function and remove from pre-op pool.  Please call with questions.  Joylene Grapes, NP 05/10/2022, 4:25 PM

## 2022-05-10 NOTE — Telephone Encounter (Signed)
Patient with diagnosis of afib on Eliquis for anticoagulation.    Procedure: Right Thumb Ulnar Collateral Ligament Repair Metacarpal Phalangeal Joint  Date of procedure: 05/13/22  CHA2DS2-VASc Score = 4  This indicates a 4.8% annual risk of stroke. The patient's score is based upon: CHF History: 0 HTN History: 1 Diabetes History: 1 Stroke History: 0 Vascular Disease History: 0 Age Score: 1 Gender Score: 1   CrCl 58mL/min using adjusted body weight  Platelet count 301K  Per office protocol, patient can hold Eliquis for 1-2 days prior to procedure.    **This guidance is not considered finalized until pre-operative APP has relayed final recommendations.**

## 2022-05-10 NOTE — Telephone Encounter (Signed)
error 

## 2022-05-13 DIAGNOSIS — S5331XA Traumatic rupture of right ulnar collateral ligament, initial encounter: Secondary | ICD-10-CM | POA: Diagnosis not present

## 2022-05-13 DIAGNOSIS — X58XXXA Exposure to other specified factors, initial encounter: Secondary | ICD-10-CM | POA: Diagnosis not present

## 2022-05-13 DIAGNOSIS — Y999 Unspecified external cause status: Secondary | ICD-10-CM | POA: Diagnosis not present

## 2022-05-13 DIAGNOSIS — S63641A Sprain of metacarpophalangeal joint of right thumb, initial encounter: Secondary | ICD-10-CM | POA: Diagnosis not present

## 2022-05-22 DIAGNOSIS — Z4789 Encounter for other orthopedic aftercare: Secondary | ICD-10-CM | POA: Diagnosis not present

## 2022-05-22 DIAGNOSIS — S5331XA Traumatic rupture of right ulnar collateral ligament, initial encounter: Secondary | ICD-10-CM | POA: Diagnosis not present

## 2022-05-23 DIAGNOSIS — E1169 Type 2 diabetes mellitus with other specified complication: Secondary | ICD-10-CM | POA: Diagnosis not present

## 2022-05-23 DIAGNOSIS — K580 Irritable bowel syndrome with diarrhea: Secondary | ICD-10-CM | POA: Diagnosis not present

## 2022-05-23 DIAGNOSIS — I10 Essential (primary) hypertension: Secondary | ICD-10-CM | POA: Diagnosis not present

## 2022-05-23 DIAGNOSIS — M159 Polyosteoarthritis, unspecified: Secondary | ICD-10-CM | POA: Diagnosis not present

## 2022-05-23 DIAGNOSIS — F419 Anxiety disorder, unspecified: Secondary | ICD-10-CM | POA: Diagnosis not present

## 2022-05-23 DIAGNOSIS — I4891 Unspecified atrial fibrillation: Secondary | ICD-10-CM | POA: Diagnosis not present

## 2022-05-23 DIAGNOSIS — R600 Localized edema: Secondary | ICD-10-CM | POA: Diagnosis not present

## 2022-05-23 DIAGNOSIS — J449 Chronic obstructive pulmonary disease, unspecified: Secondary | ICD-10-CM | POA: Diagnosis not present

## 2022-05-23 DIAGNOSIS — N1832 Chronic kidney disease, stage 3b: Secondary | ICD-10-CM | POA: Diagnosis not present

## 2022-05-23 DIAGNOSIS — D649 Anemia, unspecified: Secondary | ICD-10-CM | POA: Diagnosis not present

## 2022-05-23 DIAGNOSIS — J309 Allergic rhinitis, unspecified: Secondary | ICD-10-CM | POA: Diagnosis not present

## 2022-06-06 DIAGNOSIS — D649 Anemia, unspecified: Secondary | ICD-10-CM | POA: Diagnosis not present

## 2022-06-06 DIAGNOSIS — E1169 Type 2 diabetes mellitus with other specified complication: Secondary | ICD-10-CM | POA: Diagnosis not present

## 2022-06-06 DIAGNOSIS — F419 Anxiety disorder, unspecified: Secondary | ICD-10-CM | POA: Diagnosis not present

## 2022-06-06 DIAGNOSIS — N1832 Chronic kidney disease, stage 3b: Secondary | ICD-10-CM | POA: Diagnosis not present

## 2022-06-06 DIAGNOSIS — K580 Irritable bowel syndrome with diarrhea: Secondary | ICD-10-CM | POA: Diagnosis not present

## 2022-06-06 DIAGNOSIS — M159 Polyosteoarthritis, unspecified: Secondary | ICD-10-CM | POA: Diagnosis not present

## 2022-06-06 DIAGNOSIS — I4891 Unspecified atrial fibrillation: Secondary | ICD-10-CM | POA: Diagnosis not present

## 2022-06-06 DIAGNOSIS — J449 Chronic obstructive pulmonary disease, unspecified: Secondary | ICD-10-CM | POA: Diagnosis not present

## 2022-06-06 DIAGNOSIS — M25551 Pain in right hip: Secondary | ICD-10-CM | POA: Diagnosis not present

## 2022-06-06 DIAGNOSIS — R600 Localized edema: Secondary | ICD-10-CM | POA: Diagnosis not present

## 2022-06-06 DIAGNOSIS — J309 Allergic rhinitis, unspecified: Secondary | ICD-10-CM | POA: Diagnosis not present

## 2022-06-13 DIAGNOSIS — M79644 Pain in right finger(s): Secondary | ICD-10-CM | POA: Diagnosis not present

## 2022-06-13 DIAGNOSIS — Z4789 Encounter for other orthopedic aftercare: Secondary | ICD-10-CM | POA: Diagnosis not present

## 2022-06-13 DIAGNOSIS — S5331XA Traumatic rupture of right ulnar collateral ligament, initial encounter: Secondary | ICD-10-CM | POA: Diagnosis not present

## 2022-06-17 ENCOUNTER — Ambulatory Visit: Payer: PPO | Admitting: Cardiology

## 2022-06-19 DIAGNOSIS — M79644 Pain in right finger(s): Secondary | ICD-10-CM | POA: Diagnosis not present

## 2022-06-20 DIAGNOSIS — F419 Anxiety disorder, unspecified: Secondary | ICD-10-CM | POA: Diagnosis not present

## 2022-06-20 DIAGNOSIS — R600 Localized edema: Secondary | ICD-10-CM | POA: Diagnosis not present

## 2022-06-20 DIAGNOSIS — K580 Irritable bowel syndrome with diarrhea: Secondary | ICD-10-CM | POA: Diagnosis not present

## 2022-06-20 DIAGNOSIS — D649 Anemia, unspecified: Secondary | ICD-10-CM | POA: Diagnosis not present

## 2022-06-20 DIAGNOSIS — N1832 Chronic kidney disease, stage 3b: Secondary | ICD-10-CM | POA: Diagnosis not present

## 2022-06-20 DIAGNOSIS — I10 Essential (primary) hypertension: Secondary | ICD-10-CM | POA: Diagnosis not present

## 2022-06-20 DIAGNOSIS — M159 Polyosteoarthritis, unspecified: Secondary | ICD-10-CM | POA: Diagnosis not present

## 2022-06-20 DIAGNOSIS — J309 Allergic rhinitis, unspecified: Secondary | ICD-10-CM | POA: Diagnosis not present

## 2022-06-20 DIAGNOSIS — I4891 Unspecified atrial fibrillation: Secondary | ICD-10-CM | POA: Diagnosis not present

## 2022-06-20 DIAGNOSIS — E1169 Type 2 diabetes mellitus with other specified complication: Secondary | ICD-10-CM | POA: Diagnosis not present

## 2022-06-20 DIAGNOSIS — J449 Chronic obstructive pulmonary disease, unspecified: Secondary | ICD-10-CM | POA: Diagnosis not present

## 2022-06-21 NOTE — Progress Notes (Signed)
   Nurse Visit   Date of Encounter: 06/21/2022 ID: LISEL SIEGRIST, DOB [birth year], MRN [id number]  PCP:  Cher Nakai, MD   [facility] Providers Cardiologist:  None Electrophysiologist:  Will Meredith Leeds, MD      Visit Details   VS:  BP 126/60 (BP Location: Left Arm, Patient Position: Sitting)   Pulse 90   Ht 5\' 4"  (1.626 m)   Wt 237 lb 3.2 oz (107.6 kg)   SpO2 97%   BMI 40.72 kg/m  , BMI Body mass index is 40.72 kg/m.  Wt Readings from Last 3 Encounters:  05/10/22 237 lb 3.2 oz (107.6 kg)  05/03/22 236 lb 12.8 oz (107.4 kg)  04/19/22 232 lb (105.2 kg)     Reason for visit: EKG for eval of Flecainide  Performed today: Vitals, EKG, Provider consulted:Dr. Agustin Cree, and Education Changes (medications, testing, etc.) : Continue Flecainide to 75mg  twice daily Length of Visit: 15 minutes    Medications Adjustments/Labs and Tests Ordered: Orders Placed This Encounter  Procedures   EKG 12-Lead   No orders of the defined types were placed in this encounter.    Signed, Tyler Pita, RN  06/21/2022 1:02 PM

## 2022-06-26 DIAGNOSIS — M79644 Pain in right finger(s): Secondary | ICD-10-CM | POA: Diagnosis not present

## 2022-07-03 DIAGNOSIS — M79644 Pain in right finger(s): Secondary | ICD-10-CM | POA: Diagnosis not present

## 2022-07-05 DIAGNOSIS — Z4789 Encounter for other orthopedic aftercare: Secondary | ICD-10-CM | POA: Diagnosis not present

## 2022-07-05 DIAGNOSIS — S5331XA Traumatic rupture of right ulnar collateral ligament, initial encounter: Secondary | ICD-10-CM | POA: Diagnosis not present

## 2022-07-18 DIAGNOSIS — D649 Anemia, unspecified: Secondary | ICD-10-CM | POA: Diagnosis not present

## 2022-07-18 DIAGNOSIS — M159 Polyosteoarthritis, unspecified: Secondary | ICD-10-CM | POA: Diagnosis not present

## 2022-07-18 DIAGNOSIS — I10 Essential (primary) hypertension: Secondary | ICD-10-CM | POA: Diagnosis not present

## 2022-07-18 DIAGNOSIS — J449 Chronic obstructive pulmonary disease, unspecified: Secondary | ICD-10-CM | POA: Diagnosis not present

## 2022-07-18 DIAGNOSIS — F419 Anxiety disorder, unspecified: Secondary | ICD-10-CM | POA: Diagnosis not present

## 2022-07-18 DIAGNOSIS — N1832 Chronic kidney disease, stage 3b: Secondary | ICD-10-CM | POA: Diagnosis not present

## 2022-07-18 DIAGNOSIS — M25551 Pain in right hip: Secondary | ICD-10-CM | POA: Diagnosis not present

## 2022-07-18 DIAGNOSIS — I4891 Unspecified atrial fibrillation: Secondary | ICD-10-CM | POA: Diagnosis not present

## 2022-07-18 DIAGNOSIS — E1169 Type 2 diabetes mellitus with other specified complication: Secondary | ICD-10-CM | POA: Diagnosis not present

## 2022-07-18 DIAGNOSIS — K580 Irritable bowel syndrome with diarrhea: Secondary | ICD-10-CM | POA: Diagnosis not present

## 2022-07-18 DIAGNOSIS — J309 Allergic rhinitis, unspecified: Secondary | ICD-10-CM | POA: Diagnosis not present

## 2022-07-22 ENCOUNTER — Ambulatory Visit: Payer: PPO | Attending: Cardiology | Admitting: Cardiology

## 2022-07-22 ENCOUNTER — Encounter: Payer: Self-pay | Admitting: Cardiology

## 2022-07-22 VITALS — BP 110/74 | HR 71 | Ht 64.0 in | Wt 237.4 lb

## 2022-07-22 DIAGNOSIS — I480 Paroxysmal atrial fibrillation: Secondary | ICD-10-CM | POA: Diagnosis not present

## 2022-07-22 DIAGNOSIS — Z79899 Other long term (current) drug therapy: Secondary | ICD-10-CM

## 2022-07-22 DIAGNOSIS — I483 Typical atrial flutter: Secondary | ICD-10-CM

## 2022-07-22 DIAGNOSIS — D6869 Other thrombophilia: Secondary | ICD-10-CM | POA: Diagnosis not present

## 2022-07-22 DIAGNOSIS — I10 Essential (primary) hypertension: Secondary | ICD-10-CM

## 2022-07-22 NOTE — Patient Instructions (Signed)
Medication Instructions:  Your physician recommends that you continue on your current medications as directed. Please refer to the Current Medication list given to you today.  *If you need a refill on your cardiac medications before your next appointment, please call your pharmacy*   Lab Work: None ordered   Testing/Procedures: None ordered   Follow-Up: At [facility], you and your health needs are our priority.  As part of our continuing mission to provide you with exceptional heart care, we have created designated Provider Care Teams.  These Care Teams include your primary Cardiologist (physician) and Advanced Practice Providers (APPs -  Physician Assistants and Nurse Practitioners) who all work together to provide you with the care you need, when you need it.  Your next appointment:   as  needed  The format for your next appointment:   In Person  Provider:   Allegra Lai, MD    Thank you for choosing [facility]!!   Trinidad Curet, RN [phone]
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
## 2022-09-18 DIAGNOSIS — I82409 Acute embolism and thrombosis of unspecified deep veins of unspecified lower extremity: Secondary | ICD-10-CM | POA: Insufficient documentation

## 2022-09-18 DIAGNOSIS — J449 Chronic obstructive pulmonary disease, unspecified: Secondary | ICD-10-CM | POA: Insufficient documentation

## 2022-09-19 ENCOUNTER — Ambulatory Visit: Payer: PPO | Attending: Cardiology | Admitting: Cardiology

## 2022-09-19 ENCOUNTER — Encounter: Payer: Self-pay | Admitting: Cardiology

## 2022-09-19 VITALS — BP 130/68 | HR 93 | Ht 64.0 in | Wt 243.4 lb

## 2022-09-19 DIAGNOSIS — I10 Essential (primary) hypertension: Secondary | ICD-10-CM

## 2022-09-19 DIAGNOSIS — R0609 Other forms of dyspnea: Secondary | ICD-10-CM

## 2022-09-19 DIAGNOSIS — E782 Mixed hyperlipidemia: Secondary | ICD-10-CM

## 2022-09-19 DIAGNOSIS — I4892 Unspecified atrial flutter: Secondary | ICD-10-CM

## 2022-09-19 DIAGNOSIS — J410 Simple chronic bronchitis: Secondary | ICD-10-CM | POA: Diagnosis not present

## 2022-09-19 DIAGNOSIS — R5383 Other fatigue: Secondary | ICD-10-CM

## 2022-09-19 DIAGNOSIS — I480 Paroxysmal atrial fibrillation: Secondary | ICD-10-CM | POA: Diagnosis not present

## 2022-09-19 MED ORDER — FLECAINIDE ACETATE 50 MG PO TABS: 50.0000 mg | ORAL_TABLET | Freq: Two times a day (BID) | ORAL | 2 refills | Status: DC

## 2022-09-19 NOTE — Addendum Note (Signed)
Addended by: Baldo Ash D on: 09/19/2022 01:39 PM   Modules accepted: Orders

## 2022-09-19 NOTE — Patient Instructions (Addendum)
Medication Instructions:  DECREASE: Flecainide to  1 tablet twice daily   Lab Work: CMP, TSH, ProBNP. CBC- Today If you have labs (blood work) drawn today and your tests are completely normal, you will receive your results only by: MyChart Message (if you have MyChart) OR A paper copy in the mail If you have any lab test that is abnormal or we need to change your treatment, we will call you to review the results.   Testing/Procedures: Your physician has requested that you have an echocardiogram. Echocardiography is a painless test that uses sound waves to create images of your heart. It provides your doctor with information about the size and shape of your heart and how well your heart's chambers and valves are working. This procedure takes approximately one hour. There are no restrictions for this procedure. Please do NOT wear cologne, perfume, aftershave, or lotions (deodorant is allowed). Please arrive 15 minutes prior to your appointment time.    Follow-Up: At [facility], you and your health needs are our priority.  As part of our continuing mission to provide you with exceptional heart care, we have created designated Provider Care Teams.  These Care Teams include your primary Cardiologist (physician) and Advanced Practice Providers (APPs -  Physician Assistants and Nurse Practitioners) who all work together to provide you with the care you need, when you need it.  We recommend signing up for the patient portal called "MyChart".  Sign up information is provided on this After Visit Summary.  MyChart is used to connect with patients for Virtual Visits (Telemedicine).  Patients are able to view lab/test results, encounter notes, upcoming appointments, etc.  Non-urgent messages can be sent to your provider as well.   To learn more about what you can do with MyChart, go to [email or web address].    Your next appointment:   1 month(s)  The format for your next appointment:   In  Person  Provider:   Gypsy Balsam, MD    Other Instructions NA
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
## 2022-09-20 LAB — CBC
Hematocrit: 42 % (ref 34.0–46.6)
Hemoglobin: 13.7 g/dL (ref 11.1–15.9)
MCH: 29.5 pg (ref 26.6–33.0)
MCHC: 32.6 g/dL (ref 31.5–35.7)
MCV: 90 fL (ref 79–97)
Platelets: 356 10*3/uL (ref 150–450)
RBC: 4.65 x10E6/uL (ref 3.77–5.28)
RDW: 13.4 % (ref 11.7–15.4)
WBC: 14.4 10*3/uL — ABNORMAL HIGH (ref 3.4–10.8)

## 2022-09-20 LAB — COMPREHENSIVE METABOLIC PANEL
ALT: 25 IU/L (ref 0–32)
AST: 13 IU/L (ref 0–40)
Albumin/Globulin Ratio: 2 (ref 1.2–2.2)
Albumin: 3.9 g/dL (ref 3.9–4.9)
Alkaline Phosphatase: 102 IU/L (ref 44–121)
BUN/Creatinine Ratio: 20 (ref 12–28)
BUN: 23 mg/dL (ref 8–27)
Bilirubin Total: 0.4 mg/dL (ref 0.0–1.2)
CO2: 19 mmol/L — ABNORMAL LOW (ref 20–29)
Calcium: 9.3 mg/dL (ref 8.7–10.3)
Chloride: 101 mmol/L (ref 96–106)
Creatinine, Ser: 1.13 mg/dL — ABNORMAL HIGH (ref 0.57–1.00)
Globulin, Total: 2 g/dL (ref 1.5–4.5)
Glucose: 158 mg/dL — ABNORMAL HIGH (ref 70–99)
Potassium: 3.8 mmol/L (ref 3.5–5.2)
Sodium: 140 mmol/L (ref 134–144)
Total Protein: 5.9 g/dL — ABNORMAL LOW (ref 6.0–8.5)
eGFR: 54 mL/min/{1.73_m2} — ABNORMAL LOW (ref >59)

## 2022-09-20 LAB — TSH: TSH: 0.591 u[IU]/mL (ref 0.450–4.500)

## 2022-09-20 LAB — PRO B NATRIURETIC PEPTIDE: NT-Pro BNP: 201 pg/mL (ref 0–301)

## 2022-09-23 ENCOUNTER — Encounter: Payer: Self-pay | Admitting: Cardiology

## 2022-09-23 ENCOUNTER — Ambulatory Visit (INDEPENDENT_AMBULATORY_CARE_PROVIDER_SITE_OTHER): Payer: PPO

## 2022-09-23 ENCOUNTER — Ambulatory Visit: Payer: PPO | Attending: Cardiology | Admitting: Cardiology

## 2022-09-23 VITALS — BP 140/76 | HR 80 | Ht 64.0 in | Wt 247.0 lb

## 2022-09-23 DIAGNOSIS — Z79899 Other long term (current) drug therapy: Secondary | ICD-10-CM

## 2022-09-23 DIAGNOSIS — I480 Paroxysmal atrial fibrillation: Secondary | ICD-10-CM

## 2022-09-23 DIAGNOSIS — D6869 Other thrombophilia: Secondary | ICD-10-CM | POA: Diagnosis not present

## 2022-09-23 NOTE — Patient Instructions (Signed)
Medication Instructions:  Your physician recommends that you continue on your current medications as directed. Please refer to the Current Medication list given to you today.  *If you need a refill on your cardiac medications before your next appointment, please call your pharmacy*   Lab Work: None ordered    Testing/Procedures:                           ZIO XT- Long Term Monitor Instructions  Your physician has requested you wear a ZIO patch monitor for 14 days.  This is a single patch monitor. [employer] supplies one patch monitor per enrollment. Additional stickers are not available. Please do not apply patch if you will be having a Nuclear Stress Test,  Echocardiogram, Cardiac CT, MRI, or Chest Xray during the period you would be wearing the  monitor. The patch cannot be worn during these tests. You cannot remove and re-apply the  ZIO XT patch monitor.  Your ZIO patch monitor will be mailed 3 day USPS to your address on file. It may take 3-5 days  to receive your monitor after you have been enrolled.  Once you have received your monitor, please review the enclosed instructions. Your monitor  has already been registered assigning a specific monitor serial # to you.  Billing and Patient Assistance Program Information  We have supplied [employer] with any of your insurance information on file for billing purposes. [employer] offers a sliding scale Patient Assistance Program for patients that do not have  insurance, or whose insurance does not completely cover the cost of the ZIO monitor.  You must apply for the Patient Assistance Program to qualify for this discounted rate.  To apply, please call [employer] at [phone], select option 4, select option 2, ask to apply for  Patient Assistance Program. Meredeth Ide will ask your household income, and how many people  are in your household. They will quote your out-of-pocket cost based on that information.  [employer] will also be able to set up a  25-month, interest-free payment plan if needed.  Applying the monitor   Shave hair from upper left chest.  Hold abrader disc by orange tab. Rub abrader in 40 strokes over the upper left chest as  indicated in your monitor instructions.  Clean area with 4 enclosed alcohol pads. Let dry.  Apply patch as indicated in monitor instructions. Patch will be placed under collarbone on left  side of chest with arrow pointing upward.  Rub patch adhesive wings for 2 minutes. Remove white label marked "1". Remove the white  label marked "2". Rub patch adhesive wings for 2 additional minutes.  While looking in a mirror, press and release button in center of patch. A small green light will  flash 3-4 times. This will be your only indicator that the monitor has been turned on.  Do not shower for the first 24 hours. You may shower after the first 24 hours.  Press the button if you feel a symptom. You will hear a small click. Record Date, Time and  Symptom in the Patient Logbook.  When you are ready to remove the patch, follow instructions on the last 2 pages of Patient  Logbook. Stick patch monitor onto the last page of Patient Logbook.  Place Patient Logbook in the blue and white box. Use locking tab on box and tape box closed  securely. The blue and white box has prepaid postage on it. Please place it in  the mailbox as  soon as possible. Your physician should have your test results approximately 7 days after the  monitor has been mailed back to [facility].  Call [facility] Customer Care at [phone] if you have questions regarding  your ZIO XT patch monitor. Call them immediately if you see an orange light blinking on your  monitor.  If your monitor falls off in less than 4 days, contact our Monitor department at [phone].  If your monitor becomes loose or falls off after 4 days call [employer] at [phone] for  suggestions on securing your monitor    Follow-Up: At [facility],  you and your health needs are our priority.  As part of our continuing mission to provide you with exceptional heart care, we have created designated Provider Care Teams.  These Care Teams include your primary Cardiologist (physician) and Advanced Practice Providers (APPs -  Physician Assistants and Nurse Practitioners) who all work together to provide you with the care you need, when you need it.    Your next appointment:   To be  determined  The format for your next appointment:   In Person  Provider:   Loman Brooklyn, MD{  Thank you for choosing [facility]!!   Dory Horn, RN [phone]
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
## 2022-09-23 NOTE — Progress Notes (Unsigned)
Enrolled patient for a 14 day Zio XT  monitor to be mailed to patients home  °

## 2022-09-27 ENCOUNTER — Telehealth: Payer: Self-pay

## 2022-09-27 ENCOUNTER — Ambulatory Visit: Payer: PPO | Attending: Cardiology

## 2022-09-27 DIAGNOSIS — I480 Paroxysmal atrial fibrillation: Secondary | ICD-10-CM | POA: Diagnosis not present

## 2022-09-27 LAB — ECHOCARDIOGRAM COMPLETE
Area-P 1/2: 3.76 cm2
S' Lateral: 3.4 cm

## 2022-09-27 NOTE — Telephone Encounter (Signed)
Left message on My Chart with normal results per Dr. Krasowski's note. Routed to PCP. 

## 2022-09-27 NOTE — Telephone Encounter (Signed)
Pt viewed results in My Chart per Dr. Krasowski's note. Routed to PCP.  

## 2022-10-10 ENCOUNTER — Telehealth: Payer: Self-pay

## 2022-10-10 DIAGNOSIS — M47816 Spondylosis without myelopathy or radiculopathy, lumbar region: Secondary | ICD-10-CM | POA: Insufficient documentation

## 2022-10-10 NOTE — Telephone Encounter (Signed)
Patient notified through my chart.

## 2022-10-10 NOTE — Telephone Encounter (Signed)
-----   Message from Georgeanna Lea, MD sent at 10/03/2022  8:25 AM EDT ----- Echocardiogram showed preserved left ventricle ejection fraction, overall looks good

## 2022-10-22 ENCOUNTER — Encounter: Payer: Self-pay | Admitting: Cardiology

## 2022-10-22 ENCOUNTER — Ambulatory Visit: Payer: PPO | Attending: Cardiology | Admitting: Cardiology

## 2022-10-22 VITALS — BP 124/68 | HR 91 | Ht 64.0 in | Wt 238.0 lb

## 2022-10-22 DIAGNOSIS — I480 Paroxysmal atrial fibrillation: Secondary | ICD-10-CM

## 2022-10-22 DIAGNOSIS — E088 Diabetes mellitus due to underlying condition with unspecified complications: Secondary | ICD-10-CM

## 2022-10-22 DIAGNOSIS — E782 Mixed hyperlipidemia: Secondary | ICD-10-CM

## 2022-10-22 DIAGNOSIS — J410 Simple chronic bronchitis: Secondary | ICD-10-CM | POA: Diagnosis not present

## 2022-10-22 MED ORDER — DILTIAZEM HCL ER COATED BEADS 360 MG PO CP24: 360.0000 mg | ORAL_CAPSULE | Freq: Every day | ORAL | 3 refills | Status: DC

## 2022-10-22 NOTE — Addendum Note (Signed)
Addended by: Baldo Ash D on: 10/22/2022 08:35 AM   Modules accepted: Orders
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
## 2022-10-22 NOTE — Patient Instructions (Signed)
Medication Instructions:   INCREASE: Cardizem CD to 360mg  every day   Lab Work: Your physician recommends that you return for lab work in: when fasting You need to have labs done when you are fasting.  You can come Monday through Friday 8:30 am to 12:00 pm and 1:15 to 4:30. You do not need to make an appointment as the order has already been placed. The labs you are going to have done are Lipids.    Testing/Procedures: None Ordered   Follow-Up: At [facility], you and your health needs are our priority.  As part of our continuing mission to provide you with exceptional heart care, we have created designated Provider Care Teams.  These Care Teams include your primary Cardiologist (physician) and Advanced Practice Providers (APPs -  Physician Assistants and Nurse Practitioners) who all work together to provide you with the care you need, when you need it.  We recommend signing up for the patient portal called "MyChart".  Sign up information is provided on this After Visit Summary.  MyChart is used to connect with patients for Virtual Visits (Telemedicine).  Patients are able to view lab/test results, encounter notes, upcoming appointments, etc.  Non-urgent messages can be sent to your provider as well.   To learn more about what you can do with MyChart, go to [email or web address].    Your next appointment:   5 month(s)  The format for your next appointment:   In Person  Provider:   Gypsy Balsam, MD    Other Instructions NA

## 2022-10-24 LAB — LIPID PANEL
Chol/HDL Ratio: 3.1 ratio (ref 0.0–4.4)
Cholesterol, Total: 198 mg/dL (ref 100–199)
HDL: 63 mg/dL (ref >39)
LDL Chol Calc (NIH): 113 mg/dL — ABNORMAL HIGH (ref 0–99)
Triglycerides: 127 mg/dL (ref 0–149)
VLDL Cholesterol Cal: 22 mg/dL (ref 5–40)

## 2022-10-28 ENCOUNTER — Other Ambulatory Visit: Payer: Self-pay

## 2022-10-28 ENCOUNTER — Encounter (HOSPITAL_COMMUNITY): Payer: Self-pay

## 2022-10-28 ENCOUNTER — Emergency Department (HOSPITAL_COMMUNITY)
Admission: EM | Admit: 2022-10-28 | Discharge: 2022-10-28 | Disposition: A | Discharge status: Home / Self Care | Payer: PPO | Attending: Emergency Medicine | Admitting: Emergency Medicine

## 2022-10-28 DIAGNOSIS — J449 Chronic obstructive pulmonary disease, unspecified: Secondary | ICD-10-CM | POA: Diagnosis not present

## 2022-10-28 DIAGNOSIS — E119 Type 2 diabetes mellitus without complications: Secondary | ICD-10-CM | POA: Diagnosis not present

## 2022-10-28 DIAGNOSIS — M5442 Lumbago with sciatica, left side: Secondary | ICD-10-CM | POA: Insufficient documentation

## 2022-10-28 DIAGNOSIS — Z7901 Long term (current) use of anticoagulants: Secondary | ICD-10-CM | POA: Insufficient documentation

## 2022-10-28 DIAGNOSIS — Z79899 Other long term (current) drug therapy: Secondary | ICD-10-CM | POA: Insufficient documentation

## 2022-10-28 DIAGNOSIS — Z794 Long term (current) use of insulin: Secondary | ICD-10-CM | POA: Insufficient documentation

## 2022-10-28 DIAGNOSIS — Z7984 Long term (current) use of oral hypoglycemic drugs: Secondary | ICD-10-CM | POA: Diagnosis not present

## 2022-10-28 DIAGNOSIS — I10 Essential (primary) hypertension: Secondary | ICD-10-CM | POA: Diagnosis not present

## 2022-10-28 DIAGNOSIS — G8929 Other chronic pain: Secondary | ICD-10-CM | POA: Insufficient documentation

## 2022-10-28 MED ORDER — OXYCODONE-ACETAMINOPHEN 5-325 MG PO TABS: 1.0000 | ORAL_TABLET | Freq: Four times a day (QID) | ORAL | 0 refills | Status: DC | PRN

## 2022-10-28 MED ORDER — METHOCARBAMOL 500 MG PO TABS: 500.0000 mg | ORAL_TABLET | Freq: Two times a day (BID) | ORAL | 0 refills | Status: DC

## 2022-10-28 MED ORDER — HYDROMORPHONE HCL 1 MG/ML IJ SOLN
1.0000 mg | Freq: Once | INTRAMUSCULAR | Status: AC
  Administered 2022-10-28: 1 mg via INTRAMUSCULAR
  Filled 2022-10-28: qty 1

## 2022-10-28 NOTE — Discharge Instructions (Addendum)
Please use Tylenol for pain.  You may use 1000 mg of Tylenol every 6 hours.  Not to exceed 4 g of Tylenol within 24 hours.  

## 2022-10-28 NOTE — ED Provider Notes (Signed)
[place] EMERGENCY DEPARTMENT AT [facility] Provider Note   CSN: [id number] Arrival date & time: 10/28/22  1616     History  Chief Complaint  Patient presents with   Back Pain    Hailey Stanton is a 67 y.o. female with past medical history significant for hypertension, hyperlipidemia, diabetes, morbid obesity, paroxysmal A-fib, COPD, chronic lumbar spondylosis, lumbar back pain who presents with concern for acute on chronic worsening of her left lumbar back pain, with some radiation down her right side at this time.  Patient reports that this flare has been going on for 3 weeks.  She has been taking muscle relaxants, and narcotics at home, she reports hydrocodone and some old tramadol.  Patient reports that she has not been taking her home blood pressure or blood thinner medication in the last 2 days.   Back Pain      Home Medications Prior to Admission medications   Medication Sig Start Date End Date Taking? Authorizing Provider  apixaban (ELIQUIS) 5 MG TABS tablet Take 1 tablet (5 mg total) by mouth 2 (two) times daily. Patient taking differently: Take 500,000,000,000,000 mg by mouth 2 (two) times daily. 01/22/22   Georgeanna Lea, MD  atorvastatin (LIPITOR) 10 MG tablet Take 1 tablet (10 mg total) by mouth daily. 01/22/22   Georgeanna Lea, MD  celecoxib (CELEBREX) 200 MG capsule Take 200 mg by mouth daily.    [provider]  diltiazem (CARDIZEM CD) 360 MG 24 hr capsule Take 1 capsule (360 mg total) by mouth daily. 10/22/22   Georgeanna Lea, MD  diltiazem (CARDIZEM) 30 MG tablet Take 30 mg by mouth daily as needed (HR higher than 120).    [provider]  estradiol (ESTRACE) 0.5 MG tablet Take 0.5 mg by mouth daily.    [provider]  flecainide (TAMBOCOR) 50 MG tablet Take 1 tablet (50 mg total) by mouth 2 (two) times daily. 09/19/22   Georgeanna Lea, MD  furosemide (LASIX) 20 MG tablet Take 20 mg by mouth daily.     [provider]  glimepiride (AMARYL) 4 MG tablet Take 4 mg by mouth daily with breakfast.    [provider]  insulin glargine (LANTUS) 100 UNIT/ML injection Inject 30 Units into the skin at bedtime.    [provider]  metFORMIN (GLUCOPHAGE) 850 MG tablet Take 850 mg by mouth 2 (two) times daily with a meal.    [provider]  methocarbamol (ROBAXIN) 500 MG tablet Take 1 tablet (500 mg total) by mouth 2 (two) times daily. 10/28/22  Yes Talena Neira H, PA-C  omeprazole (PRILOSEC) 20 MG capsule Take 20 mg by mouth daily.    [provider]  oxyCODONE-acetaminophen (PERCOCET/ROXICET) 5-325 MG tablet Take 1 tablet by mouth every 6 (six) hours as needed for severe pain. 10/28/22  Yes Jenine Krisher H, PA-C  Semaglutide,0.25 or 0.5MG /DOS, (OZEMPIC, 0.25 OR 0.5 MG/DOSE,) 2 MG/1.5ML SOPN Inject 0.25 mg into the skin once a week.    [provider]  telmisartan-hydrochlorothiazide (MICARDIS HCT) 80-12.5 MG tablet Take 1 tablet by mouth daily.    [provider]      Allergies    Cefadroxil, Cephalexin, and Moxifloxacin hcl in nacl    Review of Systems   Review of Systems  Musculoskeletal:  Positive for back pain.  All other systems reviewed and are negative.   Physical Exam Updated Vital Signs BP 138/75 (BP Location: Left Arm)   Pulse Marland Kitchen)  102   Temp 97.8 F (36.6 C) (Oral)   Resp 18   Ht 5\' 4"  (1.626 m)   Wt 108 kg   SpO2 98%   BMI 40.87 kg/m  Physical Exam Vitals and nursing note reviewed.  Constitutional:      General: She is not in acute distress.    Appearance: Normal appearance.     Comments: Tearful, and in distress, but nontoxic, nonseptic appearing  HENT:     Head: Normocephalic and atraumatic.  Eyes:     General:        Right eye: No discharge.        Left eye: No discharge.  Cardiovascular:     Rate and Rhythm: Normal rate and regular rhythm.     Heart sounds: No murmur heard.    No friction  rub. No gallop.  Pulmonary:     Effort: Pulmonary effort is normal.     Breath sounds: Normal breath sounds.  Abdominal:     General: Bowel sounds are normal.     Palpations: Abdomen is soft.  Musculoskeletal:     Comments: Patient with positive straight leg raise bilaterally.  She is significant tenderness in the midline thoracic, lumbar spine.  She has no focal neurologic deficits on my exam.  Intact strength 4/5 bilateral lower extremities, decreased strength secondary to pain/effort.  Normal sensation throughout.  Skin:    General: Skin is warm and dry.     Capillary Refill: Capillary refill takes less than 2 seconds.  Neurological:     Mental Status: She is alert and oriented to person, place, and time.  Psychiatric:        Mood and Affect: Mood normal.        Behavior: Behavior normal.     ED Results / Procedures / Treatments   Labs (all labs ordered are listed, but only abnormal results are displayed) Labs Reviewed - No data to display  EKG None  Radiology No results found.  Procedures Procedures    Medications Ordered in ED Medications  HYDROmorphone (DILAUDID) injection 1 mg (has no administration in time range)  HYDROmorphone (DILAUDID) injection 1 mg (1 mg Intramuscular Given 10/28/22 1652)    ED Course/ Medical Decision Making/ A&P                             Medical Decision Making Risk Prescription drug management.   Patient with acute on chronic severe back pain.  My emergent differential diagnosis includes slipped disc, compression fracture, spondylolisthesis, less clinical concern for epidural abscess or osteomyelitis based on patient history.  No neurological deficits. Patient is with no significant ambulation secondary to pain but able to move both legs spontaneously, with normal sensation throughout on my exam.. No warning symptoms of back pain including: fecal incontinence, urinary retention or overflow incontinence, night sweats, waking from  sleep with back pain, unexplained fevers or weight loss, h/o cancer, IVDU, recent trauma. No concern for cauda equina, epidural abscess, or other emergent cause of back pain.  Given this work-up, evaluation, physical exam I do not believe that radiographic imaging is indicated at this time.  Conservative measures such as rest, ice/heat, ibuprofen, Tylenol, and  prescription for Robaxin indicated, with additional prescription for short course of narcotic pain medication in this patient given her significant narcotic requirements in the emergency department is warranted with orthopedic follow-up.  Discussed with patient that I cannot place her on steroids because  she has poorly controlled diabetes, and has not been able to manage her blood sugar in the past when on steroids, she cannot take NSAIDs secondary to her Eliquis.  I extensively reviewed previous records with Dr. Shon Baton, patient is post to follow-up in the pain management clinic in 2 weeks.  She has not tried physical therapy, aqua therapy. with orthopedic follow-up if no improvement with conservative management.  Extensive return precautions given, patient discharged in stable condition at this time.   Final Clinical Impression(s) / ED Diagnoses Final diagnoses:  Chronic bilateral low back pain with left-sided sciatica    Rx / DC Orders ED Discharge Orders          Ordered    methocarbamol (ROBAXIN) 500 MG tablet  2 times daily        10/28/22 1821    oxyCODONE-acetaminophen (PERCOCET/ROXICET) 5-325 MG tablet  Every 6 hours PRN        10/28/22 1821              Kaylany Tesoriero, Edyth Gunnels 10/28/22 Myriam Jacobson, MD 10/28/22 [phone]

## 2022-10-28 NOTE — ED Triage Notes (Signed)
PT arrives from home BIB [facility] with a c/o of lower back pain that radiates down left leg, also c/o of numbness to right leg which radiates to the knee.Pain has been ongoing for 3 weeks. PT has taken tramadol in the last 2 hrs.PT hasn't taken bp or blood thinner meds in 2 days.

## 2022-10-29 ENCOUNTER — Telehealth: Payer: Self-pay | Admitting: *Deleted

## 2022-10-29 MED ORDER — FLECAINIDE ACETATE 100 MG PO TABS: 100.0000 mg | ORAL_TABLET | Freq: Two times a day (BID) | ORAL | 6 refills | Status: DC

## 2022-10-29 NOTE — Telephone Encounter (Signed)
-----   Message from Will Jorja Loa, MD sent at 10/24/2022 12:12 PM EDT ----- Possible sinus vs atrial tachycardia. Increase flecainide to 100 mg. Needs ECG after 2 weeks.

## 2022-10-29 NOTE — Telephone Encounter (Signed)
Pt agreeable. Went to schedule RN visit EKG, pt requested [place] office.  Will forward to their office to arrange requested RN EKG visit for 10-14 days from now. Pt agreeable to plan.

## 2022-10-30 NOTE — Telephone Encounter (Signed)
Called patient and scheduled nurse visit for 06/11

## 2022-11-01 ENCOUNTER — Telehealth: Payer: Self-pay

## 2022-11-01 DIAGNOSIS — E782 Mixed hyperlipidemia: Secondary | ICD-10-CM

## 2022-11-01 MED ORDER — ATORVASTATIN CALCIUM 20 MG PO TABS: 20.0000 mg | ORAL_TABLET | Freq: Every day | ORAL | 3 refills | Status: AC

## 2022-11-01 NOTE — Telephone Encounter (Signed)
Left message on My Chart with normal results per Dr. Krasowski's note. Routed to PCP. 

## 2022-11-04 ENCOUNTER — Encounter (INDEPENDENT_AMBULATORY_CARE_PROVIDER_SITE_OTHER): Payer: Self-pay

## 2022-11-04 ENCOUNTER — Encounter (INDEPENDENT_AMBULATORY_CARE_PROVIDER_SITE_OTHER): Payer: Self-pay | Admitting: Adult Health

## 2022-11-12 ENCOUNTER — Ambulatory Visit: Payer: PPO | Attending: Cardiology

## 2022-11-12 VITALS — BP 132/70 | HR 104

## 2022-11-12 DIAGNOSIS — I480 Paroxysmal atrial fibrillation: Secondary | ICD-10-CM | POA: Diagnosis not present

## 2022-11-12 NOTE — Progress Notes (Signed)
   Nurse Visit   Date of Encounter: 11/12/2022 ID: Hailey Stanton, DOB 04/27/1956, MRN [id number]  PCP:  Simone Curia, MD   [facility] Providers Cardiologist:  None      Visit Details   VS:  BP 132/70 (BP Location: Right Arm, Patient Position: Sitting, Cuff Size: Normal)   Pulse (!) 104  , BMI There is no height or weight on file to calculate BMI.  Wt Readings from Last 3 Encounters:  10/28/22 238 lb 1.6 oz (108 kg)  10/22/22 238 lb (108 kg)  09/23/22 247 lb (112 kg)     Reason for visit: Perform EKG Performed today: Vitals, EKG, Provider consulted and Education Changes (medications, testing, etc.) : No new orders at this time Length of Visit: 25 minutes    Medications Adjustments/Labs and Tests Ordered: Orders Placed This Encounter  Procedures   EKG 12-Lead   No orders of the defined types were placed in this encounter.    Signed, Samson Frederic, RN  11/12/2022 10:31 AM

## 2022-11-19 ENCOUNTER — Telehealth: Payer: Self-pay

## 2022-11-19 NOTE — Telephone Encounter (Signed)
   Name: Hailey Stanton  DOB: [birth year]  MRN: [id number]   Primary Cardiologist: None  Chart reviewed as part of pre-operative protocol coverage. Patient was contacted 11/19/2022 in reference to pre-operative risk assessment for pending surgery as outlined below.  Hailey Stanton was last seen on 10/22/2022 by Dr. Bing Matter.  Since that day, Hailey Stanton has done from a cardiac standpoint.  She injured her back on Mother's Day and since this time her activity has been somewhat limited, prior to that she was able to complete greater than 4 METS without difficulty.  Therefore, based on ACC/AHA guidelines, the patient would be at acceptable risk for the planned procedure without further cardiovascular testing.    Per office protocol, patient can hold Eliquis for 3 days prior to procedure.  Please resume Eliquis as soon as possible postprocedure, at the discretion of the surgeon.   The patient was advised that if she develops new symptoms prior to surgery to contact our office to arrange for a follow-up visit, and she verbalized understanding.  I will route this recommendation to the requesting party via Epic fax function and remove from pre-op pool. Please call with questions.  Joylene Grapes, NP 11/19/2022, 12:12 PM

## 2022-11-19 NOTE — Telephone Encounter (Signed)
Patient with diagnosis of afib and DVT on Eliquis for anticoagulation.    Procedure: lumbar microdiscectomy Date of procedure: TBD  CHA2DS2-VASc Score = 4  This indicates a 4.8% annual risk of stroke. The patient's score is based upon: CHF History: 0 HTN History: 1 Diabetes History: 1 Stroke History: 0 Vascular Disease History: 0 Age Score: 1 Gender Score: 1   Provoked DVT in 2000.   CrCl 40mL/min using adjusted body weight due to obesity Platelet count 356K  Per office protocol, patient can hold Eliquis for 3 days prior to procedure.    **This guidance is not considered finalized until pre-operative APP has relayed final recommendations.**

## 2022-11-19 NOTE — Telephone Encounter (Signed)
   [facility] Pre-operative Risk Assessment    Request for surgical clearance:  What type of surgery is being performed? Lumbar Microdiscectomy    When is this surgery scheduled? TBD   What type of clearance is required (medical clearance vs. Pharmacy clearance to hold med vs. Both)? Both  Are there any medications that need to be held prior to surgery and how long?Not specified   Practice name and name of physician performing surgery? Dr. Hoyt Koch at [facility]   What is your office phone number: [phone] ext: [phone]    7.   What is your office fax number: [phone]  8.   Anesthesia type (None, local, MAC, general) ? General    Hailey Stanton 11/19/2022, 9:18 AM  _________________________________________________________________   (provider comments below)

## 2022-11-25 ENCOUNTER — Telehealth: Payer: Self-pay | Admitting: Cardiology

## 2022-11-25 NOTE — Telephone Encounter (Signed)
Becky from Dr. Marigene Ehlers office would like a callback regarding having pt's EKG results faxed over to their office at [phone] as soon as possible. Please advise

## 2022-11-25 NOTE — Telephone Encounter (Signed)
Routed June 2024 EKG to Dr. Marigene Ehlers office per request.

## 2022-12-16 IMAGING — MG MM BREAST BX W/ LOC DEV 1ST LESION IMAGE BX SPEC STEREO GUIDE*R*
4 series · 4 of 4 positions shown · non-contrast
Comparison: Previous exams.
COMPARISON: Previous exams.

Addendum:
CLINICAL DATA: Patient presents for stereotactic guided core biopsy
of RIGHT calcifications.

EXAM:
RIGHT BREAST STEREOTACTIC CORE NEEDLE BIOPSY

[R (1 of 4)]
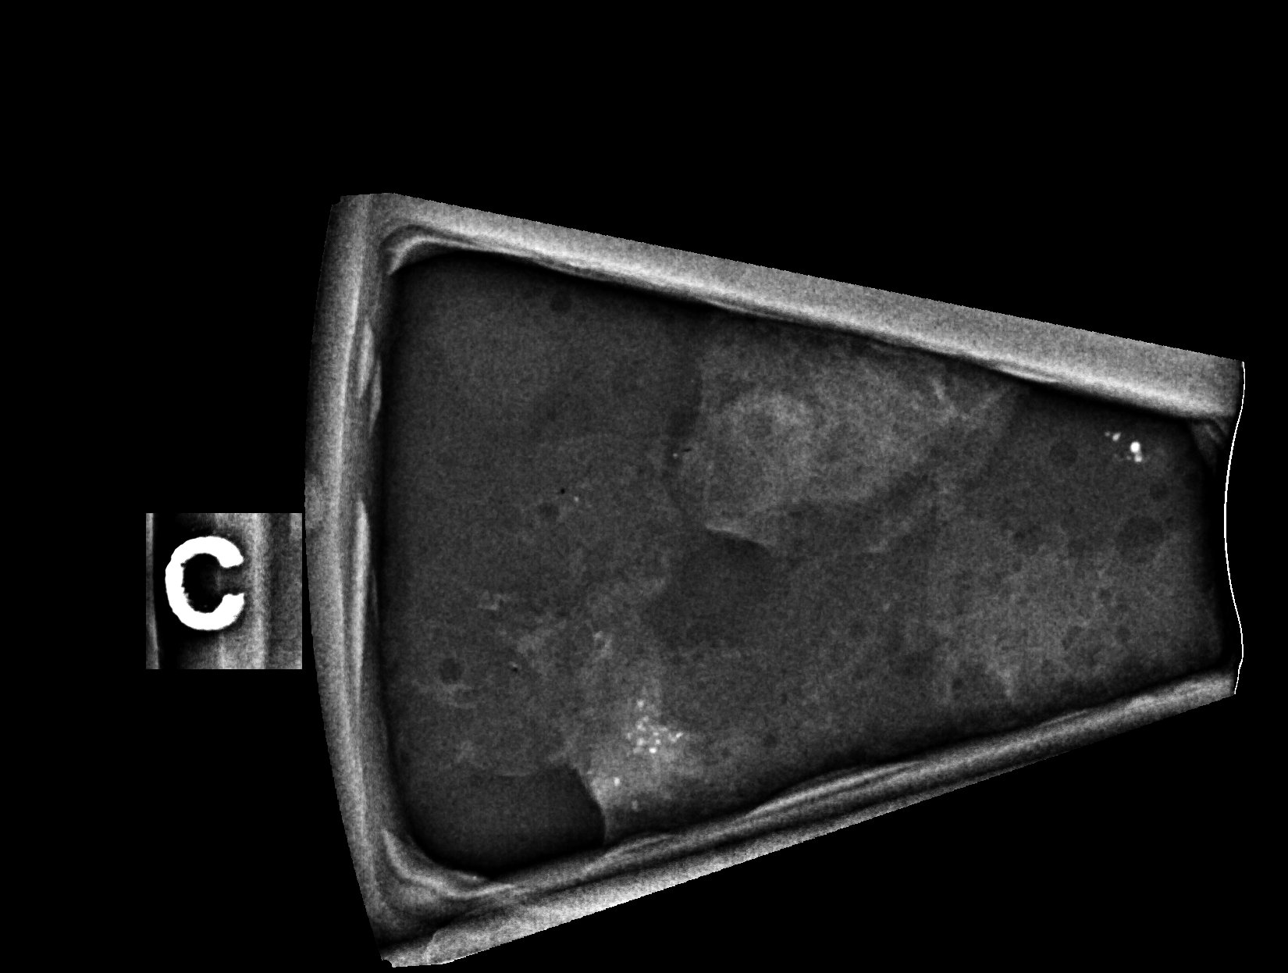

[R (2 of 4)]
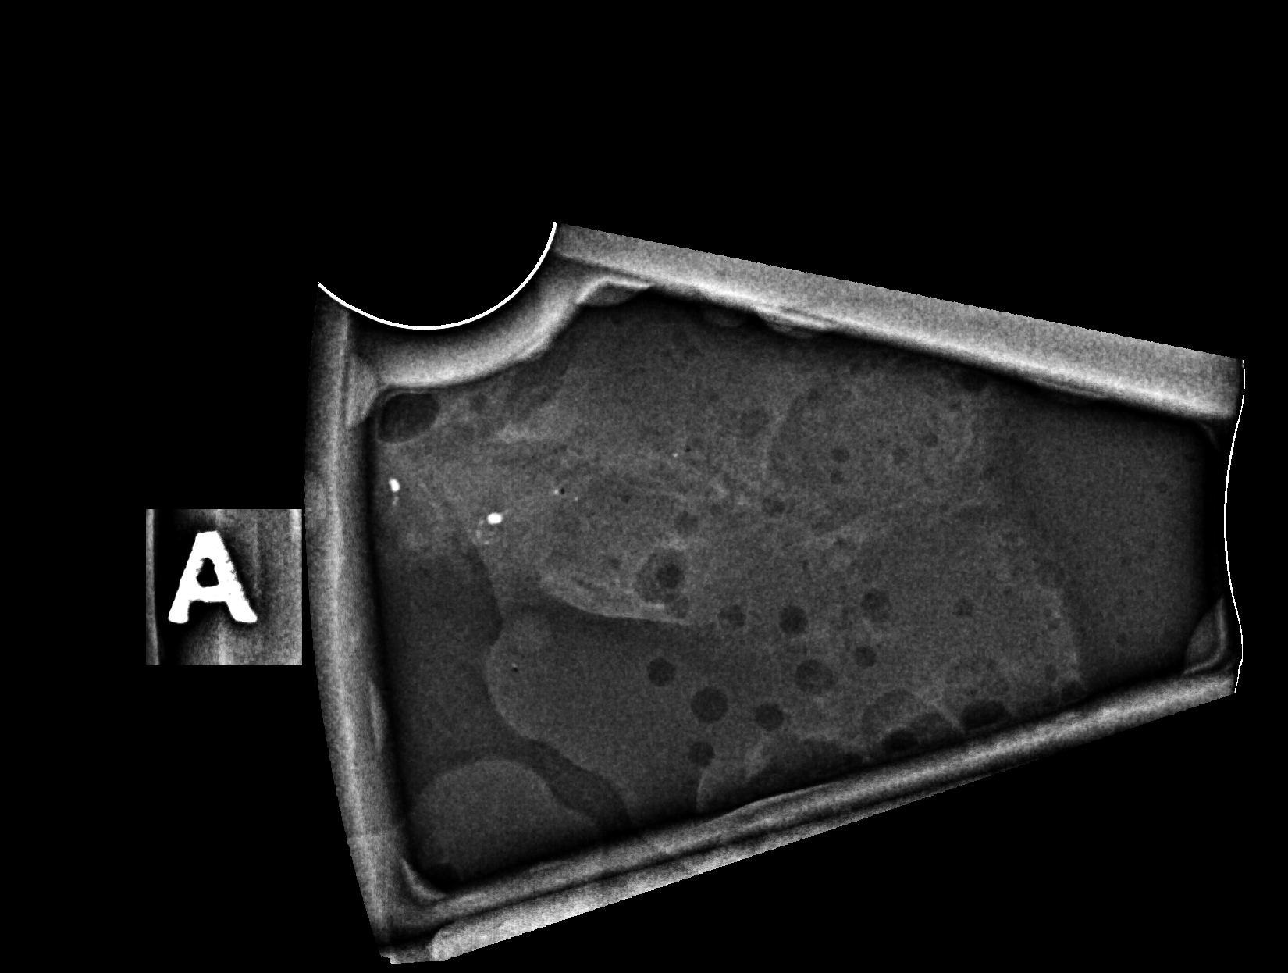

[R (3 of 4)]
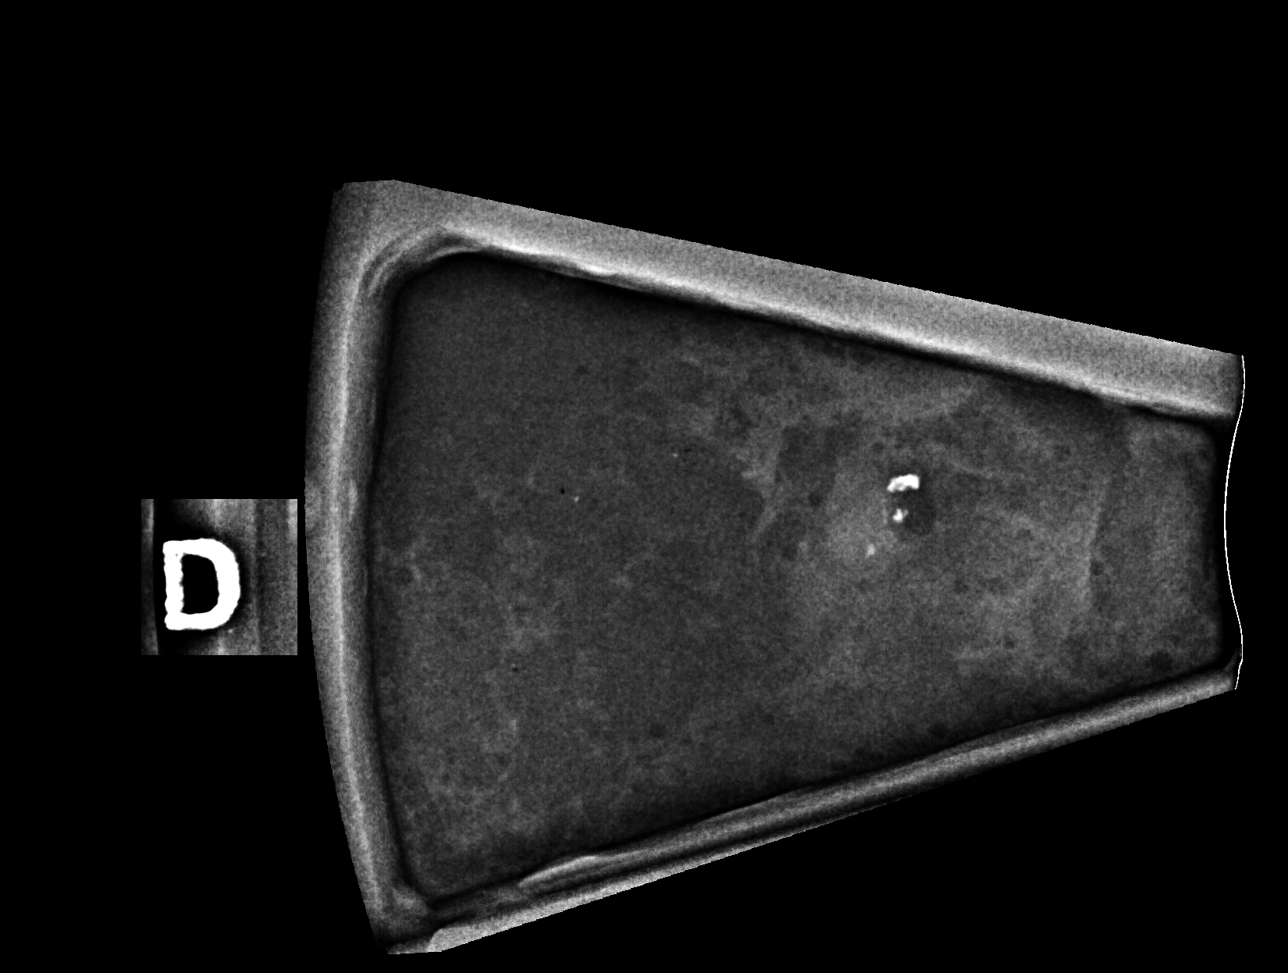

[R (4 of 4)]
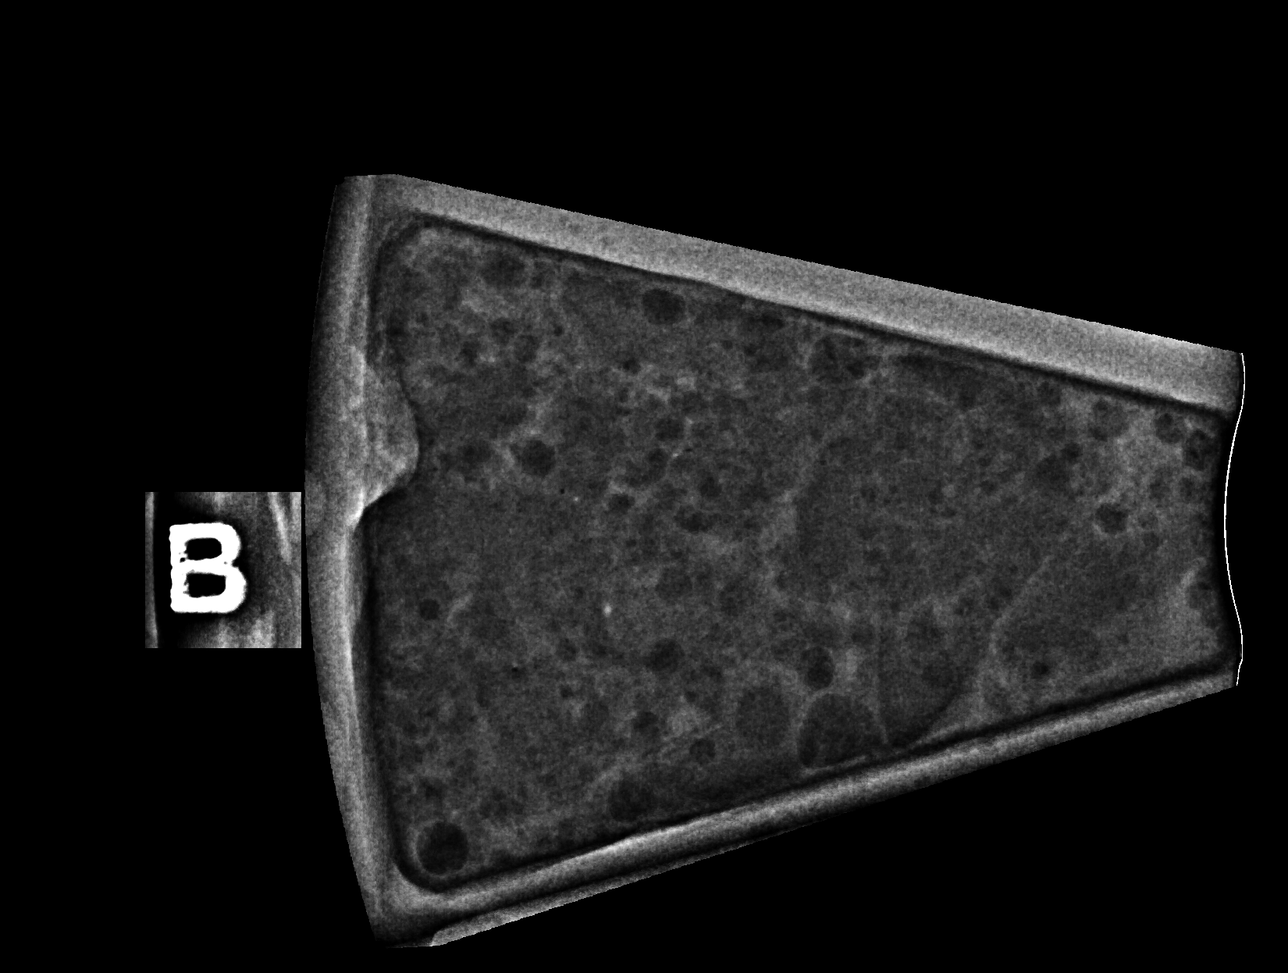

[4 of 4 positions shown; findings below may reference images not displayed]



Using sterile technique and 1% Lidocaine as local anesthetic, under
stereotactic guidance, a 9 gauge vacuum assisted device was used to
perform core needle biopsy of calcifications in the UPPER OUTER
QUADRANT of the RIGHT using a craniocaudal approach. Specimen
radiograph was performed showing calcifications in numerous tissue
samples. Specimens with calcifications are identified for pathology.

Lesion quadrant: UPPER-OUTER QUADRANT RIGHT breast

At the conclusion of the procedure, X shaped tissue marker clip was
deployed into the biopsy cavity. Follow-up 2-view mammogram was
performed and dictated separately.
IMPRESSION: Stereotactic-guided biopsy of RIGHT breast calcifications. No
apparent complications.

ADDENDUM:
Pathology revealed FIBROADENOMA WITH CALCIFICATION- NO ATYPIA OR
MALIGNANCY of the RIGHT breast, upper outer, X clip. This was found
to be concordant by Dr. Tabita Danny.

Pathology results were discussed with the patient by telephone. The
patient reported doing well after the biopsy with tenderness at the
site. Post biopsy instructions and care were reviewed and questions
were answered. The patient was encouraged to call The [REDACTED]

The patient was instructed to return for annual screening
mammography due December 2021.

Pathology results reported by Deeqa Rayaan Adlaho RN on 02/16/2021.



Using sterile technique and 1% Lidocaine as local anesthetic, under
stereotactic guidance, a 9 gauge vacuum assisted device was used to
perform core needle biopsy of calcifications in the UPPER OUTER
QUADRANT of the RIGHT using a craniocaudal approach. Specimen
radiograph was performed showing calcifications in numerous tissue
samples. Specimens with calcifications are identified for pathology.

Lesion quadrant: UPPER-OUTER QUADRANT RIGHT breast

At the conclusion of the procedure, X shaped tissue marker clip was
deployed into the biopsy cavity. Follow-up 2-view mammogram was
performed and dictated separately.
IMPRESSION: Stereotactic-guided biopsy of RIGHT breast calcifications. No
apparent complications.

## 2022-12-19 ENCOUNTER — Encounter (HOSPITAL_COMMUNITY): Payer: Self-pay | Admitting: Physician Assistant

## 2022-12-19 ENCOUNTER — Encounter (HOSPITAL_COMMUNITY): Payer: Self-pay | Admitting: *Deleted

## 2022-12-19 ENCOUNTER — Other Ambulatory Visit: Payer: Self-pay

## 2022-12-19 ENCOUNTER — Other Ambulatory Visit: Payer: Self-pay | Admitting: Neurosurgery

## 2022-12-19 NOTE — Progress Notes (Signed)
Anesthesia Chart Review: Same day workup  67 year old female with pertinent history including HTN, HLD, IDDM2, paroxysmal A-fib, former smoker with associated COPD, hiatal hernia, chronic pain.  Cardiac clearance per telephone encounter 11/19/2022 by  Bernadene Person, NP states, "Chart reviewed as part of pre-operative protocol coverage. Patient was contacted 11/19/2022 in reference to pre-operative risk assessment for pending surgery as outlined below.  Hailey Stanton was last seen on 10/22/2022 by Dr. Bing Matter.  Since that day, Hailey Stanton has done from a cardiac standpoint.  She injured her back on Mother's Day and since this time her activity has been somewhat limited, prior to that she was able to complete greater than 4 METS without difficulty. Therefore, based on ACC/AHA guidelines, the patient would be at acceptable risk for the planned procedure without further cardiovascular testing. Per office protocol, patient can hold Eliquis for 3 days prior to procedure.  Please resume Eliquis as soon as possible postprocedure, at the discretion of the surgeon."  Patient is on once weekly GLP agonist Ozempic, reports last dose 12/09/22.  Patient will need day of surgery labs and evaluation.  EKG 11/12/2022: Sinus tachycardia with PACs.  Left axis deviation.  Rate 104.  Event monitor 10/17/2022: Predominant rhythm was sinus rhythm Multiple SVT episodes, longest 27 seconds 11.1% ventricular ectopy Less than 1% supraventricular ectopy Patient triggered episodes associated with sinus rhythm as well as sinus rhythm with possible atrial tachycardia  TTE 09/27/2022:  1. Left ventricular ejection fraction, by estimation, is 60 to 65%. The  left ventricle has normal function. The left ventricle has no regional  wall motion abnormalities. There is moderate left ventricular hypertrophy.  Left ventricular diastolic  parameters are consistent with Grade I diastolic dysfunction (impaired  relaxation). The  average left ventricular global longitudinal strain is  -12.2 %. The global longitudinal strain is abnormal.   2. Right ventricular systolic function is normal. The right ventricular  size is normal.   3. The mitral valve is normal in structure. No evidence of mitral valve  regurgitation. No evidence of mitral stenosis.   4. The aortic valve is normal in structure. Aortic valve regurgitation is  not visualized. No aortic stenosis is present.   5. The inferior vena cava is normal in size with greater than 50%  respiratory variability, suggesting right atrial pressure of 3 mmHg.     Zannie Cove [facility]/Anesthesiology Phone [phone] 12/19/2022 4:02 PM

## 2022-12-19 NOTE — Progress Notes (Addendum)
Hailey Stanton denies chest pain or shortness of breath.  Patient denies having any s/s of Covid in her household, also denies any known exposure to Covid. Hailey Stanton denies  any s/s of upper or lower respiratory in the past 8 weeks.   Hailey. Stanton's cardiologist is Dr. Nani Gasser, patient was cleared by cardiology on 11/19/22, patient was instructed to hold Eliquis 3 days. PCP is Dr. Simone Curia.  Hailey. Stanton has type II diabetes,patient was instructed to hold Ozempic 1 week, I instructed patient to take 15 units of Lantus at bedtime tonight. I instructed Hailey Stanton to not take Glimepride in am. Hailey Stanton reports that fasting CBGs run 120-190. I instructed patient to check CBG after awaking and every 2 hours until arrival  to the hospital. I instructed patient if CBG is less than 70 to take 4 Glucose Tablets or 1 tube of Glucose Gel or 1/2 cup of a clear juice. Recheck CBG in 15 minutes if CBG is not over 70 call, pre- op desk at [phone] for further instructions.   Hailey Stanton was scheduled to have surgery at [facility], the anesthesiologist, heard some fluid when he checked her lungs then  to be done at [facility].  I sent a fax request to the [facility] , requesting records from today and I informed Hailey Poles, PA- C.

## 2022-12-19 NOTE — Anesthesia Preprocedure Evaluation (Deleted)
Anesthesia Evaluation    Airway        Dental   Pulmonary former smoker          Cardiovascular hypertension,      Neuro/Psych    GI/Hepatic   Endo/Other  diabetes    Renal/GU      Musculoskeletal   Abdominal   Peds  Hematology   Anesthesia Other Findings   Reproductive/Obstetrics                              Anesthesia Physical Anesthesia Plan  ASA:   Anesthesia Plan:    Post-op Pain Management:    Induction:   PONV Risk Score and Plan:   Airway Management Planned:   Additional Equipment:   Intra-op Plan:   Post-operative Plan:   Informed Consent:   Plan Discussed with:   Anesthesia Plan Comments: (PAT note by Antionette Poles, PA-C: 67 year old female with pertinent history including HTN, HLD, IDDM2, paroxysmal A-fib, former smoker with associated COPD, hiatal hernia, chronic pain.  Cardiac clearance per telephone encounter 11/19/2022 by  Bernadene Person, NP states, "Chart reviewed as part of pre-operative protocol coverage. Patient was contacted 11/19/2022 in reference to pre-operative risk assessment for pending surgery as outlined below.  Hailey Stanton was last seen on 10/22/2022 by Dr. Bing Matter.  Since that day, Hailey Stanton has done from a cardiac standpoint.  She injured her back on Mother's Day and since this time her activity has been somewhat limited, prior to that she was able to complete greater than 4 METS without difficulty. Therefore, based on ACC/AHA guidelines, the patient would be at acceptable risk for the planned procedure without further cardiovascular testing. Per office protocol, patient can hold Eliquis for 3 days prior to procedure.  Please resume Eliquis as soon as possible postprocedure, at the discretion of the surgeon."  Patient is on once weekly GLP agonist Ozempic, reports last dose 12/09/22.  Patient will need day of surgery labs and evaluation.  EKG  11/12/2022: Sinus tachycardia with PACs.  Left axis deviation.  Rate 104.  Event monitor 10/17/2022: Predominant rhythm was sinus rhythm Multiple SVT episodes, longest 27 seconds 11.1% ventricular ectopy Less than 1% supraventricular ectopy Patient triggered episodes associated with sinus rhythm as well as sinus rhythm with possible atrial tachycardia  TTE 09/27/2022:  1. Left ventricular ejection fraction, by estimation, is 60 to 65%. The  left ventricle has normal function. The left ventricle has no regional  wall motion abnormalities. There is moderate left ventricular hypertrophy.  Left ventricular diastolic  parameters are consistent with Grade I diastolic dysfunction (impaired  relaxation). The average left ventricular global longitudinal strain is  -12.2 %. The global longitudinal strain is abnormal.   2. Right ventricular systolic function is normal. The right ventricular  size is normal.   3. The mitral valve is normal in structure. No evidence of mitral valve  regurgitation. No evidence of mitral stenosis.   4. The aortic valve is normal in structure. Aortic valve regurgitation is  not visualized. No aortic stenosis is present.   5. The inferior vena cava is normal in size with greater than 50%  respiratory variability, suggesting right atrial pressure of 3 mmHg.    )         Anesthesia Quick Evaluation

## 2022-12-20 NOTE — Progress Notes (Signed)
Patient called and made aware of surgery cancellation due to system outage.

## 2022-12-23 NOTE — Progress Notes (Signed)
Updated patient with new arrival time of 1130 for surgery rescheduled on 12/24/2022

## 2022-12-24 ENCOUNTER — Ambulatory Visit (HOSPITAL_COMMUNITY): Payer: PPO

## 2022-12-24 ENCOUNTER — Other Ambulatory Visit: Payer: Self-pay

## 2022-12-24 ENCOUNTER — Encounter (HOSPITAL_COMMUNITY): Payer: Self-pay

## 2022-12-24 ENCOUNTER — Observation Stay (HOSPITAL_COMMUNITY)
Admission: RE | Admit: 2022-12-24 | Discharge: 2022-12-25 | Disposition: A | Discharge status: Home / Self Care | Payer: PPO | Attending: Neurosurgery | Admitting: Neurosurgery

## 2022-12-24 ENCOUNTER — Encounter (HOSPITAL_BASED_OUTPATIENT_CLINIC_OR_DEPARTMENT_OTHER): Payer: PPO

## 2022-12-24 ENCOUNTER — Encounter (HOSPITAL_COMMUNITY)
Admission: RE | Disposition: A | Discharge status: Home / Self Care | Payer: Self-pay | Source: Home / Self Care | Attending: Neurosurgery

## 2022-12-24 DIAGNOSIS — I480 Paroxysmal atrial fibrillation: Secondary | ICD-10-CM | POA: Insufficient documentation

## 2022-12-24 DIAGNOSIS — Z8616 Personal history of COVID-19: Secondary | ICD-10-CM | POA: Insufficient documentation

## 2022-12-24 DIAGNOSIS — M4726 Other spondylosis with radiculopathy, lumbar region: Secondary | ICD-10-CM

## 2022-12-24 DIAGNOSIS — I10 Essential (primary) hypertension: Secondary | ICD-10-CM | POA: Diagnosis not present

## 2022-12-24 DIAGNOSIS — Z87891 Personal history of nicotine dependence: Secondary | ICD-10-CM | POA: Diagnosis not present

## 2022-12-24 DIAGNOSIS — M5116 Intervertebral disc disorders with radiculopathy, lumbar region: Secondary | ICD-10-CM | POA: Diagnosis present

## 2022-12-24 DIAGNOSIS — J449 Chronic obstructive pulmonary disease, unspecified: Secondary | ICD-10-CM | POA: Insufficient documentation

## 2022-12-24 DIAGNOSIS — Z86718 Personal history of other venous thrombosis and embolism: Secondary | ICD-10-CM | POA: Insufficient documentation

## 2022-12-24 DIAGNOSIS — E119 Type 2 diabetes mellitus without complications: Secondary | ICD-10-CM | POA: Insufficient documentation

## 2022-12-24 DIAGNOSIS — Z7984 Long term (current) use of oral hypoglycemic drugs: Secondary | ICD-10-CM | POA: Insufficient documentation

## 2022-12-24 DIAGNOSIS — Z79899 Other long term (current) drug therapy: Secondary | ICD-10-CM | POA: Diagnosis not present

## 2022-12-24 DIAGNOSIS — Z7901 Long term (current) use of anticoagulants: Secondary | ICD-10-CM | POA: Diagnosis not present

## 2022-12-24 DIAGNOSIS — Z96651 Presence of right artificial knee joint: Secondary | ICD-10-CM | POA: Diagnosis not present

## 2022-12-24 DIAGNOSIS — Z794 Long term (current) use of insulin: Secondary | ICD-10-CM | POA: Insufficient documentation

## 2022-12-24 HISTORY — PX: LUMBAR LAMINECTOMY/ DECOMPRESSION WITH MET-RX: SHX5959

## 2022-12-24 HISTORY — DX: Paroxysmal atrial fibrillation: I48.0

## 2022-12-24 HISTORY — DX: Peripheral vascular disease, unspecified: I73.9

## 2022-12-24 HISTORY — DX: Cardiac arrhythmia, unspecified: I49.9

## 2022-12-24 HISTORY — DX: Polyneuropathy, unspecified: G62.9

## 2022-12-24 LAB — CBC
HCT: 42.2 % (ref 36.0–46.0)
Hemoglobin: 13.9 g/dL (ref 12.0–15.0)
MCH: 31.3 pg (ref 26.0–34.0)
MCHC: 32.9 g/dL (ref 30.0–36.0)
MCV: 95 fL (ref 80.0–100.0)
Platelets: 275 10*3/uL (ref 150–400)
RBC: 4.44 MIL/uL (ref 3.87–5.11)
RDW: 13.4 % (ref 11.5–15.5)
WBC: 8.7 10*3/uL (ref 4.0–10.5)
nRBC: 0 % (ref 0.0–0.2)

## 2022-12-24 LAB — BASIC METABOLIC PANEL
Anion gap: 13 (ref 5–15)
BUN: 11 mg/dL (ref 8–23)
CO2: 24 mmol/L (ref 22–32)
Calcium: 8.5 mg/dL — ABNORMAL LOW (ref 8.9–10.3)
Chloride: 104 mmol/L (ref 98–111)
Creatinine, Ser: 0.66 mg/dL (ref 0.44–1.00)
GFR, Estimated: >60 mL/min (ref >60)
Glucose, Bld: 153 mg/dL — ABNORMAL HIGH (ref 70–99)
Potassium: 2.7 mmol/L — CL (ref 3.5–5.1)
Sodium: 141 mmol/L (ref 135–145)

## 2022-12-24 LAB — SURGICAL PCR SCREEN
MRSA, PCR: NEGATIVE
Staphylococcus aureus: NEGATIVE

## 2022-12-24 LAB — GLUCOSE, CAPILLARY
Glucose-Capillary: 139 mg/dL — ABNORMAL HIGH (ref 70–99)
Glucose-Capillary: 125 mg/dL — ABNORMAL HIGH (ref 70–99)
Glucose-Capillary: 215 mg/dL — ABNORMAL HIGH (ref 70–99)
Glucose-Capillary: 418 mg/dL — ABNORMAL HIGH (ref 70–99)
Glucose-Capillary: 422 mg/dL — ABNORMAL HIGH (ref 70–99)

## 2022-12-24 LAB — HEMOGLOBIN A1C
Hgb A1c MFr Bld: 7 % — ABNORMAL HIGH (ref 4.8–5.6)
Mean Plasma Glucose: 154.2 mg/dL

## 2022-12-24 SURGERY — LUMBAR LAMINECTOMY/ DECOMPRESSION WITH MET-RX
Anesthesia: General | Site: Spine Lumbar | Laterality: Left

## 2022-12-24 MED ORDER — MIDAZOLAM HCL 2 MG/2ML IJ SOLN
INTRAMUSCULAR | Status: DC | PRN
  Administered 2022-12-24: 2 mg via INTRAVENOUS

## 2022-12-24 MED ORDER — METHOCARBAMOL 500 MG PO TABS
500.0000 mg | ORAL_TABLET | Freq: Four times a day (QID) | ORAL | Status: DC | PRN
  Administered 2022-12-24 – 2022-12-25 (×2): 500 mg via ORAL
  Filled 2022-12-24 (×2): qty 1

## 2022-12-24 MED ORDER — ORAL CARE MOUTH RINSE: 15.0000 mL | Freq: Once | OROMUCOSAL | Status: AC

## 2022-12-24 MED ORDER — CHLORHEXIDINE GLUCONATE CLOTH 2 % EX PADS: 6.0000 | MEDICATED_PAD | Freq: Once | CUTANEOUS | Status: DC

## 2022-12-24 MED ORDER — DEXAMETHASONE SODIUM PHOSPHATE 10 MG/ML IJ SOLN
INTRAMUSCULAR | Status: AC
  Filled 2022-12-24: qty 1

## 2022-12-24 MED ORDER — AMISULPRIDE (ANTIEMETIC) 5 MG/2ML IV SOLN
10.0000 mg | Freq: Once | INTRAVENOUS | Status: AC
  Administered 2022-12-24: 10 mg via INTRAVENOUS

## 2022-12-24 MED ORDER — SCOPOLAMINE 1 MG/3DAYS TD PT72
MEDICATED_PATCH | TRANSDERMAL | Status: AC
  Filled 2022-12-24: qty 1

## 2022-12-24 MED ORDER — SUGAMMADEX SODIUM 200 MG/2ML IV SOLN
INTRAVENOUS | Status: DC | PRN
  Administered 2022-12-24: 200 mg via INTRAVENOUS

## 2022-12-24 MED ORDER — SUCCINYLCHOLINE CHLORIDE 200 MG/10ML IV SOSY
PREFILLED_SYRINGE | INTRAVENOUS | Status: AC
  Filled 2022-12-24: qty 10

## 2022-12-24 MED ORDER — VALSARTAN-HYDROCHLOROTHIAZIDE 320-25 MG PO TABS: 1.0000 | ORAL_TABLET | Freq: Every morning | ORAL | Status: DC

## 2022-12-24 MED ORDER — ONDANSETRON HCL 4 MG/2ML IJ SOLN
INTRAMUSCULAR | Status: AC
  Filled 2022-12-24: qty 2

## 2022-12-24 MED ORDER — VANCOMYCIN HCL 1500 MG/300ML IV SOLN
1500.0000 mg | Freq: Once | INTRAVENOUS | Status: AC
  Administered 2022-12-25: 1500 mg via INTRAVENOUS
  Filled 2022-12-24: qty 300

## 2022-12-24 MED ORDER — GLIMEPIRIDE 2 MG PO TABS
4.0000 mg | ORAL_TABLET | Freq: Every day | ORAL | Status: DC
  Administered 2022-12-25: 4 mg via ORAL
  Filled 2022-12-24: qty 2

## 2022-12-24 MED ORDER — INSULIN ASPART 100 UNIT/ML IJ SOLN: 0.0000 [IU] | INTRAMUSCULAR | Status: DC | PRN

## 2022-12-24 MED ORDER — PROPOFOL 10 MG/ML IV BOLUS
INTRAVENOUS | Status: DC | PRN
  Administered 2022-12-24: 150 mg via INTRAVENOUS

## 2022-12-24 MED ORDER — METHOCARBAMOL 1000 MG/10ML IJ SOLN: 500.0000 mg | Freq: Four times a day (QID) | INTRAVENOUS | Status: DC | PRN

## 2022-12-24 MED ORDER — METFORMIN HCL 850 MG PO TABS
850.0000 mg | ORAL_TABLET | Freq: Two times a day (BID) | ORAL | Status: DC
  Administered 2022-12-25: 850 mg via ORAL
  Filled 2022-12-24 (×2): qty 1

## 2022-12-24 MED ORDER — DILTIAZEM HCL ER COATED BEADS 360 MG PO CP24
360.0000 mg | ORAL_CAPSULE | Freq: Every day | ORAL | Status: DC
  Administered 2022-12-25: 360 mg via ORAL
  Filled 2022-12-24: qty 1

## 2022-12-24 MED ORDER — ATORVASTATIN CALCIUM 10 MG PO TABS
20.0000 mg | ORAL_TABLET | Freq: Every day | ORAL | Status: DC
  Administered 2022-12-24 – 2022-12-25 (×2): 20 mg via ORAL
  Filled 2022-12-24 (×2): qty 2

## 2022-12-24 MED ORDER — FUROSEMIDE 20 MG PO TABS
20.0000 mg | ORAL_TABLET | Freq: Two times a day (BID) | ORAL | Status: DC
  Administered 2022-12-25: 20 mg via ORAL
  Filled 2022-12-24: qty 1

## 2022-12-24 MED ORDER — VANCOMYCIN HCL 1500 MG/300ML IV SOLN
1500.0000 mg | INTRAVENOUS | Status: AC
  Administered 2022-12-24: 1500 mg via INTRAVENOUS
  Filled 2022-12-24: qty 300

## 2022-12-24 MED ORDER — LIDOCAINE 2% (20 MG/ML) 5 ML SYRINGE
INTRAMUSCULAR | Status: AC
  Filled 2022-12-24: qty 5

## 2022-12-24 MED ORDER — VECURONIUM BROMIDE 10 MG IV SOLR
INTRAVENOUS | Status: DC | PRN
  Administered 2022-12-24: 5 mg via INTRAVENOUS
  Administered 2022-12-24: 3 mg via INTRAVENOUS

## 2022-12-24 MED ORDER — METHYLPREDNISOLONE ACETATE 80 MG/ML IJ SUSP
INTRAMUSCULAR | Status: AC
  Filled 2022-12-24: qty 1

## 2022-12-24 MED ORDER — HYDROCHLOROTHIAZIDE 25 MG PO TABS
25.0000 mg | ORAL_TABLET | Freq: Every day | ORAL | Status: DC
  Administered 2022-12-25: 25 mg via ORAL
  Filled 2022-12-24: qty 1

## 2022-12-24 MED ORDER — PROPOFOL 10 MG/ML IV BOLUS
INTRAVENOUS | Status: AC
  Filled 2022-12-24: qty 20

## 2022-12-24 MED ORDER — DEXAMETHASONE SODIUM PHOSPHATE 10 MG/ML IJ SOLN
INTRAMUSCULAR | Status: DC | PRN
  Administered 2022-12-24: 5 mg via INTRAVENOUS

## 2022-12-24 MED ORDER — PHENOL 1.4 % MT LIQD: 1.0000 | OROMUCOSAL | Status: DC | PRN

## 2022-12-24 MED ORDER — CHLORHEXIDINE GLUCONATE 0.12 % MT SOLN: 15.0000 mL | Freq: Once | OROMUCOSAL | Status: AC

## 2022-12-24 MED ORDER — DILTIAZEM HCL 30 MG PO TABS: 30.0000 mg | ORAL_TABLET | Freq: Every day | ORAL | Status: DC | PRN

## 2022-12-24 MED ORDER — FENTANYL CITRATE (PF) 250 MCG/5ML IJ SOLN
INTRAMUSCULAR | Status: AC
  Filled 2022-12-24: qty 5

## 2022-12-24 MED ORDER — POTASSIUM CHLORIDE 10 MEQ/100ML IV SOLN
10.0000 meq | INTRAVENOUS | Status: AC
  Administered 2022-12-24 (×2): 10 meq via INTRAVENOUS
  Filled 2022-12-24 (×3): qty 100

## 2022-12-24 MED ORDER — HYDROCODONE-ACETAMINOPHEN 5-325 MG PO TABS
1.0000 | ORAL_TABLET | ORAL | Status: DC | PRN
  Administered 2022-12-25 (×2): 1 via ORAL
  Filled 2022-12-24 (×2): qty 1

## 2022-12-24 MED ORDER — LACTATED RINGERS IV SOLN: INTRAVENOUS | Status: DC

## 2022-12-24 MED ORDER — THROMBIN 5000 UNITS EX SOLR
CUTANEOUS | Status: AC
  Filled 2022-12-24: qty 5000

## 2022-12-24 MED ORDER — HYDROCODONE-ACETAMINOPHEN 5-325 MG PO TABS
2.0000 | ORAL_TABLET | ORAL | Status: DC | PRN
  Administered 2022-12-25 (×2): 2 via ORAL
  Filled 2022-12-24 (×3): qty 2

## 2022-12-24 MED ORDER — VECURONIUM BROMIDE 10 MG IV SOLR
INTRAVENOUS | Status: AC
  Filled 2022-12-24: qty 10

## 2022-12-24 MED ORDER — MENTHOL 3 MG MT LOZG: 1.0000 | LOZENGE | OROMUCOSAL | Status: DC | PRN

## 2022-12-24 MED ORDER — SODIUM CHLORIDE (PF) 0.9 % IJ SOLN
INTRAMUSCULAR | Status: AC
  Filled 2022-12-24: qty 10

## 2022-12-24 MED ORDER — 0.9 % SODIUM CHLORIDE (POUR BTL) OPTIME
TOPICAL | Status: DC | PRN
  Administered 2022-12-24: 1000 mL

## 2022-12-24 MED ORDER — APIXABAN 5 MG PO TABS: 5.0000 mg | ORAL_TABLET | Freq: Two times a day (BID) | ORAL | Status: DC

## 2022-12-24 MED ORDER — POLYETHYLENE GLYCOL 3350 17 G PO PACK: 17.0000 g | PACK | Freq: Every day | ORAL | Status: DC | PRN

## 2022-12-24 MED ORDER — FENTANYL CITRATE (PF) 100 MCG/2ML IJ SOLN
INTRAMUSCULAR | Status: AC
  Filled 2022-12-24: qty 2

## 2022-12-24 MED ORDER — INSULIN ASPART 100 UNIT/ML IJ SOLN
0.0000 [IU] | Freq: Every day | INTRAMUSCULAR | Status: DC
  Administered 2022-12-24: 5 [IU] via SUBCUTANEOUS

## 2022-12-24 MED ORDER — DOCUSATE SODIUM 100 MG PO CAPS
100.0000 mg | ORAL_CAPSULE | Freq: Two times a day (BID) | ORAL | Status: DC
  Administered 2022-12-24 – 2022-12-25 (×2): 100 mg via ORAL
  Filled 2022-12-24 (×2): qty 1

## 2022-12-24 MED ORDER — SODIUM CHLORIDE 0.9 % IV SOLN: INTRAVENOUS | Status: DC

## 2022-12-24 MED ORDER — ONDANSETRON HCL 4 MG/2ML IJ SOLN
INTRAMUSCULAR | Status: DC | PRN
  Administered 2022-12-24: 4 mg via INTRAVENOUS

## 2022-12-24 MED ORDER — ACETAMINOPHEN 325 MG PO TABS: 650.0000 mg | ORAL_TABLET | ORAL | Status: DC | PRN

## 2022-12-24 MED ORDER — SUCCINYLCHOLINE CHLORIDE 200 MG/10ML IV SOSY
PREFILLED_SYRINGE | INTRAVENOUS | Status: DC | PRN
  Administered 2022-12-24: 120 mg via INTRAVENOUS

## 2022-12-24 MED ORDER — VANCOMYCIN HCL IN DEXTROSE 1-5 GM/200ML-% IV SOLN
INTRAVENOUS | Status: AC
  Filled 2022-12-24: qty 200

## 2022-12-24 MED ORDER — SODIUM CHLORIDE 0.9 % IV SOLN: 250.0000 mL | INTRAVENOUS | Status: DC

## 2022-12-24 MED ORDER — CHLORHEXIDINE GLUCONATE 0.12 % MT SOLN
OROMUCOSAL | Status: AC
  Administered 2022-12-24: 15 mL via OROMUCOSAL
  Filled 2022-12-24: qty 15

## 2022-12-24 MED ORDER — PANTOPRAZOLE SODIUM 40 MG PO TBEC
40.0000 mg | DELAYED_RELEASE_TABLET | Freq: Every day | ORAL | Status: DC
  Administered 2022-12-25: 40 mg via ORAL
  Filled 2022-12-24: qty 1

## 2022-12-24 MED ORDER — IRBESARTAN 150 MG PO TABS
300.0000 mg | ORAL_TABLET | Freq: Every day | ORAL | Status: DC
  Administered 2022-12-25: 300 mg via ORAL
  Filled 2022-12-24: qty 2

## 2022-12-24 MED ORDER — LIDOCAINE-EPINEPHRINE 1 %-1:100000 IJ SOLN
INTRAMUSCULAR | Status: AC
  Filled 2022-12-24: qty 1

## 2022-12-24 MED ORDER — MIDAZOLAM HCL 2 MG/2ML IJ SOLN
INTRAMUSCULAR | Status: AC
  Filled 2022-12-24: qty 2

## 2022-12-24 MED ORDER — BUPIVACAINE HCL (PF) 0.5 % IJ SOLN
INTRAMUSCULAR | Status: AC
  Filled 2022-12-24: qty 30

## 2022-12-24 MED ORDER — INSULIN ASPART 100 UNIT/ML IJ SOLN
0.0000 [IU] | Freq: Three times a day (TID) | INTRAMUSCULAR | Status: DC
  Administered 2022-12-25: 8 [IU] via SUBCUTANEOUS

## 2022-12-24 MED ORDER — SCOPOLAMINE 1 MG/3DAYS TD PT72
MEDICATED_PATCH | TRANSDERMAL | Status: DC | PRN
  Administered 2022-12-24: 1 via TRANSDERMAL

## 2022-12-24 MED ORDER — SODIUM CHLORIDE 0.9% FLUSH: 3.0000 mL | INTRAVENOUS | Status: DC | PRN

## 2022-12-24 MED ORDER — SODIUM CHLORIDE 0.9% FLUSH: 3.0000 mL | Freq: Two times a day (BID) | INTRAVENOUS | Status: DC

## 2022-12-24 MED ORDER — FLEET ENEMA 7-19 GM/118ML RE ENEM: 1.0000 | ENEMA | Freq: Once | RECTAL | Status: DC | PRN

## 2022-12-24 MED ORDER — DEXMEDETOMIDINE HCL IN NACL 80 MCG/20ML IV SOLN
INTRAVENOUS | Status: DC | PRN
  Administered 2022-12-24: 10 ug via INTRAVENOUS

## 2022-12-24 MED ORDER — THROMBIN 5000 UNITS EX SOLR: OROMUCOSAL | Status: DC | PRN

## 2022-12-24 MED ORDER — FENTANYL CITRATE (PF) 250 MCG/5ML IJ SOLN
INTRAMUSCULAR | Status: DC | PRN
  Administered 2022-12-24: 100 ug via INTRAVENOUS
  Administered 2022-12-24: 50 ug via INTRAVENOUS
  Administered 2022-12-24: 100 ug via INTRAVENOUS

## 2022-12-24 MED ORDER — ACETAMINOPHEN 10 MG/ML IV SOLN
INTRAVENOUS | Status: AC
  Filled 2022-12-24: qty 100

## 2022-12-24 MED ORDER — ACETAMINOPHEN 650 MG RE SUPP: 650.0000 mg | RECTAL | Status: DC | PRN

## 2022-12-24 MED ORDER — FLECAINIDE ACETATE 100 MG PO TABS
100.0000 mg | ORAL_TABLET | Freq: Two times a day (BID) | ORAL | Status: DC
  Administered 2022-12-24 – 2022-12-25 (×2): 100 mg via ORAL
  Filled 2022-12-24 (×3): qty 1

## 2022-12-24 MED ORDER — METHYLPREDNISOLONE ACETATE 80 MG/ML IJ SUSP
INTRAMUSCULAR | Status: DC | PRN
  Administered 2022-12-24: 80 mg

## 2022-12-24 MED ORDER — ONDANSETRON HCL 4 MG/2ML IJ SOLN: 4.0000 mg | Freq: Four times a day (QID) | INTRAMUSCULAR | Status: DC | PRN

## 2022-12-24 MED ORDER — POTASSIUM CHLORIDE 10 MEQ/100ML IV SOLN
INTRAVENOUS | Status: DC | PRN
  Administered 2022-12-24: 10 meq via INTRAVENOUS

## 2022-12-24 MED ORDER — ACETAMINOPHEN 10 MG/ML IV SOLN
1000.0000 mg | Freq: Once | INTRAVENOUS | Status: DC | PRN
  Administered 2022-12-24: 1000 mg via INTRAVENOUS

## 2022-12-24 MED ORDER — AMISULPRIDE (ANTIEMETIC) 5 MG/2ML IV SOLN
INTRAVENOUS | Status: AC
  Filled 2022-12-24: qty 4

## 2022-12-24 MED ORDER — LIDOCAINE 2% (20 MG/ML) 5 ML SYRINGE
INTRAMUSCULAR | Status: DC | PRN
  Administered 2022-12-24: 60 mg via INTRAVENOUS

## 2022-12-24 MED ORDER — LIDOCAINE-EPINEPHRINE 1 %-1:100000 IJ SOLN
INTRAMUSCULAR | Status: DC | PRN
  Administered 2022-12-24: 16 mL

## 2022-12-24 MED ORDER — INSULIN GLARGINE-YFGN 100 UNIT/ML ~~LOC~~ SOLN
30.0000 [IU] | Freq: Every day | SUBCUTANEOUS | Status: DC
  Administered 2022-12-24: 30 [IU] via SUBCUTANEOUS
  Filled 2022-12-24 (×3): qty 0.3

## 2022-12-24 MED ORDER — KETOROLAC TROMETHAMINE 15 MG/ML IJ SOLN
15.0000 mg | Freq: Four times a day (QID) | INTRAMUSCULAR | Status: DC
  Administered 2022-12-24 – 2022-12-25 (×3): 15 mg via INTRAVENOUS
  Filled 2022-12-24 (×3): qty 1

## 2022-12-24 MED ORDER — FENTANYL CITRATE (PF) 100 MCG/2ML IJ SOLN
25.0000 ug | INTRAMUSCULAR | Status: DC | PRN
  Administered 2022-12-24: 25 ug via INTRAVENOUS

## 2022-12-24 MED ORDER — ONDANSETRON HCL 4 MG PO TABS: 4.0000 mg | ORAL_TABLET | Freq: Four times a day (QID) | ORAL | Status: DC | PRN

## 2022-12-24 MED ORDER — PHENYLEPHRINE 80 MCG/ML (10ML) SYRINGE FOR IV PUSH (FOR BLOOD PRESSURE SUPPORT)
PREFILLED_SYRINGE | INTRAVENOUS | Status: DC | PRN
  Administered 2022-12-24: 160 ug via INTRAVENOUS

## 2022-12-24 SURGICAL SUPPLY — 58 items
ADH SKN CLS APL DERMABOND .7 (GAUZE/BANDAGES/DRESSINGS) ×1
APL SKNCLS STERI-STRIP NONHPOA (GAUZE/BANDAGES/DRESSINGS)
BAG COUNTER SPONGE SURGICOUNT (BAG) ×1 IMPLANT
BAG SPNG CNTER NS LX DISP (BAG) ×1
BENZOIN TINCTURE PRP APPL 2/3 (GAUZE/BANDAGES/DRESSINGS) IMPLANT
BLADE CLIPPER SURG (BLADE) IMPLANT
BUR CARBIDE MATCH 3.0 (BURR) IMPLANT
BUR SURGICAL HEAD PROX 3X12.5 (BURR) ×1 IMPLANT
BURR SURGICAL HEAD PROX 3X12.5 (BURR) ×1
CANISTER SUCT 3000ML PPV (MISCELLANEOUS) ×1 IMPLANT
DERMABOND ADVANCED .7 DNX12 (GAUZE/BANDAGES/DRESSINGS) IMPLANT
DRAPE C-ARM 42X72 X-RAY (DRAPES) ×2 IMPLANT
DRAPE LAPAROTOMY 100X72X124 (DRAPES) ×1 IMPLANT
DRAPE MICROSCOPE SLANT 54X150 (MISCELLANEOUS) ×1 IMPLANT
DRAPE SURG 17X23 STRL (DRAPES) ×1 IMPLANT
DRESSING MEPILEX FLEX 4X4 (GAUZE/BANDAGES/DRESSINGS) ×1 IMPLANT
DRSG MEPILEX FLEX 4X4 (GAUZE/BANDAGES/DRESSINGS) ×1
DRSG OPSITE POSTOP 3X4 (GAUZE/BANDAGES/DRESSINGS) IMPLANT
DURAPREP 26ML APPLICATOR (WOUND CARE) ×1 IMPLANT
ELECT BLADE INSULATED 6.5IN (ELECTROSURGICAL) ×1
ELECT REM PT RETURN 9FT ADLT (ELECTROSURGICAL) ×1
ELECTRODE BLDE INSULATED 6.5IN (ELECTROSURGICAL) ×1 IMPLANT
ELECTRODE REM PT RTRN 9FT ADLT (ELECTROSURGICAL) ×1 IMPLANT
GAUZE 4X4 16PLY ~~LOC~~+RFID DBL (SPONGE) IMPLANT
GAUZE SPONGE 4X4 12PLY STRL (GAUZE/BANDAGES/DRESSINGS) IMPLANT
GLOVE BIO SURGEON STRL SZ7 (GLOVE) ×2 IMPLANT
GLOVE BIOGEL PI IND STRL 7.5 (GLOVE) ×3 IMPLANT
GLOVE ECLIPSE 7.5 STRL STRAW (GLOVE) ×1 IMPLANT
GOWN STRL REUS W/ TWL LRG LVL3 (GOWN DISPOSABLE) ×2 IMPLANT
GOWN STRL REUS W/ TWL XL LVL3 (GOWN DISPOSABLE) ×1 IMPLANT
GOWN STRL REUS W/TWL 2XL LVL3 (GOWN DISPOSABLE) IMPLANT
GOWN STRL REUS W/TWL LRG LVL3 (GOWN DISPOSABLE) ×2
GOWN STRL REUS W/TWL XL LVL3 (GOWN DISPOSABLE) ×1
HEMOSTAT POWDER KIT SURGIFOAM (HEMOSTASIS) ×1 IMPLANT
IV CATH AUTO 14GX1.75 SAFE ORG (IV SOLUTION) ×1 IMPLANT
KIT BASIN OR (CUSTOM PROCEDURE TRAY) ×1 IMPLANT
KIT TURNOVER KIT B (KITS) ×1 IMPLANT
NDL HYPO 18GX1.5 BLUNT FILL (NEEDLE) IMPLANT
NDL HYPO 25X1 1.5 SAFETY (NEEDLE) ×1 IMPLANT
NDL SPNL 18GX3.5 QUINCKE PK (NEEDLE) IMPLANT
NEEDLE HYPO 18GX1.5 BLUNT FILL (NEEDLE) IMPLANT
NEEDLE HYPO 25X1 1.5 SAFETY (NEEDLE) ×1 IMPLANT
NEEDLE SPNL 18GX3.5 QUINCKE PK (NEEDLE) IMPLANT
NS IRRIG 1000ML POUR BTL (IV SOLUTION) ×1 IMPLANT
PACK LAMINECTOMY NEURO (CUSTOM PROCEDURE TRAY) ×1 IMPLANT
PAD ARMBOARD 7.5X6 YLW CONV (MISCELLANEOUS) ×3 IMPLANT
SPIKE FLUID TRANSFER (MISCELLANEOUS) ×1 IMPLANT
SPONGE T-LAP 4X18 ~~LOC~~+RFID (SPONGE) IMPLANT
STRIP CLOSURE SKIN 1/2X4 (GAUZE/BANDAGES/DRESSINGS) IMPLANT
SUT MNCRL AB 4-0 PS2 18 (SUTURE) ×1 IMPLANT
SUT VIC AB 0 CT1 18XCR BRD8 (SUTURE) IMPLANT
SUT VIC AB 0 CT1 8-18 (SUTURE)
SUT VIC AB 2-0 CP2 18 (SUTURE) ×1 IMPLANT
SUT VIC AB 3-0 SH 8-18 (SUTURE) ×1 IMPLANT
SYR 3ML LL SCALE MARK (SYRINGE) IMPLANT
TOWEL GREEN STERILE (TOWEL DISPOSABLE) ×1 IMPLANT
TOWEL GREEN STERILE FF (TOWEL DISPOSABLE) ×1 IMPLANT
WATER STERILE IRR 1000ML POUR (IV SOLUTION) ×1 IMPLANT

## 2022-12-24 NOTE — Anesthesia Preprocedure Evaluation (Signed)
Anesthesia Evaluation  Patient identified by MRN, date of birth, ID band Patient awake    Reviewed: Allergy & Precautions, NPO status , Patient's Chart, lab work & pertinent test results  History of Anesthesia Complications (+) PONV and history of anesthetic complications  Airway Mallampati: II  TM Distance: >3 FB Neck ROM: Full    Dental no notable dental hx.    Pulmonary COPD, former smoker   Pulmonary exam normal        Cardiovascular hypertension, Pt. on medications + Peripheral Vascular Disease  + dysrhythmias Atrial Fibrillation  Rhythm:Regular Rate:Normal  ECHO: 1. Left ventricular ejection fraction, by estimation, is 60 to 65%. The left ventricle has normal function. The left ventricle has no regional wall motion abnormalities. There is moderate left ventricular hypertrophy. Left ventricular diastolic parameters are consistent with Grade I diastolic dysfunction (impaired relaxation). The average left ventricular global longitudinal strain is -12.2 %. The global longitudinal strain is abnormal.  2. Right ventricular systolic function is normal. The right ventricular size is normal.  3. The mitral valve is normal in structure. No evidence of mitral valve regurgitation. No evidence of mitral stenosis.  4. The aortic valve is normal in structure. Aortic valve regurgitation is not visualized. No aortic stenosis is present.  5. The inferior vena cava is normal in size with greater than 50% respiratory variability, suggesting right atrial pressure of 3 mmHg.    Neuro/Psych   Anxiety Depression    negative neurological ROS     GI/Hepatic Neg liver ROS, hiatal hernia,GERD  Medicated,,  Endo/Other  diabetes, Type 2, Oral Hypoglycemic Agents, Insulin Dependent    Renal/GU   negative genitourinary   Musculoskeletal  (+) Arthritis , Osteoarthritis,    Abdominal Normal abdominal exam  (+)   Peds  Hematology  (+) Blood  dyscrasia, anemia   Anesthesia Other Findings   Reproductive/Obstetrics                             Anesthesia Physical Anesthesia Plan  ASA: 3  Anesthesia Plan: General   Post-op Pain Management:    Induction: Intravenous  PONV Risk Score and Plan: 4 or greater and Ondansetron, Dexamethasone, Midazolam and Treatment may vary due to age or medical condition  Airway Management Planned: Mask and Oral ETT  Additional Equipment: None  Intra-op Plan:   Post-operative Plan: Extubation in OR  Informed Consent: I have reviewed the patients History and Physical, chart, labs and discussed the procedure including the risks, benefits and alternatives for the proposed anesthesia with the patient or authorized representative who has indicated his/her understanding and acceptance.     Dental advisory given  Plan Discussed with: CRNA  Anesthesia Plan Comments:        Anesthesia Quick Evaluation

## 2022-12-24 NOTE — Progress Notes (Addendum)
Potassium 2.7. Dr Nance Pew notified. New order for potassium run x3. First run started. Pt educated and verbalized understanding.   Dr Maisie Fus aware of potassium result of 2.7.

## 2022-12-24 NOTE — Anesthesia Procedure Notes (Signed)
Procedure Name: Intubation Date/Time: 12/24/2022 3:32 PM  Performed by: Owens Loffler, RNPre-anesthesia Checklist: Patient identified, Emergency Drugs available, Suction available, Patient being monitored and Timeout performed Patient Re-evaluated:Patient Re-evaluated prior to induction Oxygen Delivery Method: Circle system utilized Preoxygenation: Pre-oxygenation with 100% oxygen Induction Type: IV induction Ventilation: Mask ventilation without difficulty Laryngoscope Size: Mac and 3 Grade View: Grade I Tube type: Oral Tube size: 7.0 mm Number of attempts: 1 Airway Equipment and Method: Stylet Placement Confirmation: ETT inserted through vocal cords under direct vision, positive ETCO2, CO2 detector and breath sounds checked- equal and bilateral Secured at: 22 cm Tube secured with: Tape Dental Injury: Teeth and Oropharynx as per pre-operative assessment

## 2022-12-24 NOTE — Progress Notes (Signed)
Pharmacy Antibiotic Note  Hailey Stanton is a 67 y.o. female admitted on 12/24/2022 for surgery.  Pharmacy has been consulted for Vancomycin dosing for post-op prophylaxis.  Patient received Vancomycin 1500mg  IV x 1 pre-op ~ 1400 today.  No drain in place.  Plan: Vancomycin 1500mg  IV x 1 dose - due 7/24 at 0200. No further doses indicated.  [employer], Pharm.D., BCPS Clinical Pharmacist  12/24/2022 6:19 PM

## 2022-12-24 NOTE — Transfer of Care (Signed)
Immediate Anesthesia Transfer of Care Note  Patient: Hailey Stanton  Procedure(s) Performed: MIS MICRODISCECTOMY, L23 (Left: Spine Lumbar)  Patient Location: PACU  Anesthesia Type:General  Level of Consciousness: drowsy and patient cooperative  Airway & Oxygen Therapy: Patient Spontanous Breathing and Patient connected to nasal cannula oxygen  Post-op Assessment: Report given to RN, Post -op Vital signs reviewed and stable, and Patient moving all extremities X 4  Post vital signs: Reviewed and stable  Last Vitals:  Vitals Value Taken Time  BP 147/75 12/24/22 1730  Temp    Pulse 100 12/24/22 1730  Resp 23 12/24/22 1730  SpO2 93 % 12/24/22 1730  Vitals shown include unfiled device data.  Last Pain:  Vitals:   12/24/22 1210  TempSrc:   PainSc: 4          Complications: There were no known notable events for this encounter.

## 2022-12-24 NOTE — Op Note (Signed)
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 Left L2-3 laminotomy, medial facetectomy for excision of herniated disc using tubular retractor system 2.  Use of microscope for microdissection  SURGEON: Dr. Hoyt Koch, MD  ASSISTANT: Patrici Ranks, PA. Please note, there were no qualified trainees available to assist with the procedure.  An assistant was required for aid in retraction of the neural elements.   ANESTHESIA: General Endotracheal  EBL: 50 ml  SPECIMENS: None  DRAINS: None  COMPLICATIONS: none  CONDITION: Stable to PCAU  HISTORY: Hailey Stanton is a 67 y.o. female who initially presented to the outpatient clinic with lumbar radiculopathy. MRI showed left L2-3 herniated disc with inferior migration.  The patient had failed nonsurgical management.  Therefore, microdiscectomy was offered to the patient.  Risks, benefits, alternatives, and expected convalescence were discussed.  Risks discussed included, but were not limited to, bleeding, pain, infection, scar, recurrent disc, instability, CSF leak, weakness, numbness, paralysis, and death.  informed consent was obtained and the patient wished to proceed.  PROCEDURE IN DETAIL: The patient was brought to the operating room. After induction of general anesthesia, the patient was positioned on the operative table in the prone position on a Wilson frame with all pressure points meticulously padded. The skin of the low back was then prepped and draped in the usual sterile fashion.  Under fluoroscopy, the correct levels were identified and marked out on the skin, and after timeout was conducted, the skin was infiltrated with local anesthetic.  Paramedian skin incision was then made sharply over the affected level and the subcutaneous tissue and fascia were incised.  Initial dilator was then passed to the  interspace under fluoroscopic guidance.  The paraspinous muscle attachments were dissected from the left L2 lamina using the dilators.  Successive dilators were used until a 20 mm tubular retractor was placed under fluoroscopic guidance and locked in place with an attachment arm.  Microscope was then introduced into the field.  Small amount of remaining musculature was dissected from the lamina and the interspace was visualized as well.  The medial facet was also exposed.  High-speed drill was used to perform a laminotomy as well as a modest medial facetectomy.  The ligamentum flavum was then removed with rongeurs.  The thecal sac and traversing nerve root were identified.  The epidural disc space was dissected with a 4 Penfield.  Underlying inferiorly extruded disc was tightly adherent to the nerve root dural sleeve and required careful dissection to allow medial mobilization of the nerve root.  The disc herniation was incised with a 15 blade and pituitary rongeur was used to remove several moderate sized disc fragments.  Smaller additional fragments were also removed further inferiorly, below the level of the pedicle using angled instruments.  Following removal of the herniated disc, the nerve root appeared much more relaxed.  Woodson probe was used to ensure good decompression and there was no longer significant's impingement of the nerve roots.  Meticulous hemostasis was obtained.  The wound was irrigated thoroughly with bacitracin impregnated irrigation.  Depo-Medrol was then placed over the nerve root.  The tubular retractor was then withdrawn with hemostasis in the muscle obtained with bipolar.  The muscles were injected with half percent Marcaine.  The fascia was closed with 0 Vicryl stitches.  The dermal layer was closed with 2-0 Vicryl stitches in buried interrupted fashion.  The skin was closed  with 4-0 Monocryl in subcuticular manner followed by Dermabond.  A sterile dressing was placed.  Patient was  then flipped supine and extubated by the anesthesia service.  All counts were correct at the end of surgery.  No complications were noted.
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
## 2022-12-25 ENCOUNTER — Encounter (HOSPITAL_COMMUNITY): Payer: Self-pay | Admitting: Neurosurgery

## 2022-12-25 DIAGNOSIS — M5116 Intervertebral disc disorders with radiculopathy, lumbar region: Secondary | ICD-10-CM | POA: Diagnosis not present

## 2022-12-25 LAB — GLUCOSE, CAPILLARY
Glucose-Capillary: 273 mg/dL — ABNORMAL HIGH (ref 70–99)
Glucose-Capillary: 317 mg/dL — ABNORMAL HIGH (ref 70–99)

## 2022-12-25 LAB — BASIC METABOLIC PANEL
Anion gap: 17 — ABNORMAL HIGH (ref 5–15)
BUN: 18 mg/dL (ref 8–23)
CO2: 18 mmol/L — ABNORMAL LOW (ref 22–32)
Calcium: 8.3 mg/dL — ABNORMAL LOW (ref 8.9–10.3)
Chloride: 98 mmol/L (ref 98–111)
Creatinine, Ser: 1.13 mg/dL — ABNORMAL HIGH (ref 0.44–1.00)
GFR, Estimated: 54 mL/min — ABNORMAL LOW (ref >60)
Glucose, Bld: 342 mg/dL — ABNORMAL HIGH (ref 70–99)
Potassium: 3.5 mmol/L (ref 3.5–5.1)
Sodium: 133 mmol/L — ABNORMAL LOW (ref 135–145)

## 2022-12-25 LAB — MAGNESIUM: Magnesium: 1.1 mg/dL — ABNORMAL LOW (ref 1.7–2.4)

## 2022-12-25 MED ORDER — MAGNESIUM SULFATE 50 % IJ SOLN
6.0000 g | Freq: Once | INTRAVENOUS | Status: AC
  Administered 2022-12-25: 6 g via INTRAVENOUS
  Filled 2022-12-25: qty 12

## 2022-12-25 MED ORDER — POTASSIUM CHLORIDE CRYS ER 20 MEQ PO TBCR
40.0000 meq | EXTENDED_RELEASE_TABLET | Freq: Once | ORAL | Status: AC
  Administered 2022-12-25: 40 meq via ORAL
  Filled 2022-12-25: qty 2

## 2022-12-25 MED ORDER — METHOCARBAMOL 500 MG PO TABS: 500.0000 mg | ORAL_TABLET | Freq: Four times a day (QID) | ORAL | 1 refills | Status: DC | PRN

## 2022-12-25 NOTE — Progress Notes (Signed)
Physical Therapy Treatment  Patient Details Name: Hailey Stanton MRN: [id number] DOB: 1956/03/23 Today's Date: 12/25/2022   History of Present Illness Pt is a 67 y/o female who presents s/p L L2-3 laminotomy facetectomy on 12/24/2022. PMH significant for COPD, DM, anixety, OA of knee, depression, kidney stones, HTN.    PT Comments  Pt progressing towards physical therapy goals. Was able to perform transfers and ambulation with increased independence, and therapist provided gross min guard assist throughout session. Gait belt issued and reviewed precautions. Will continue to follow.      Assistance Recommended at Discharge Intermittent Supervision/Assistance  If plan is discharge home, recommend the following:  Can travel by private vehicle    A little help with walking and/or transfers;A little help with bathing/dressing/bathroom;Assistance with cooking/housework;Assist for transportation;Help with stairs or ramp for entrance      Equipment Recommendations  None recommended by PT    Recommendations for Other Services       Precautions / Restrictions Precautions Precautions: Back;Fall Precaution Booklet Issued: Yes (comment) Precaution Comments: Reviewed handout and pt was cued for precautions during functional mobility. Spinal Brace:  (No brace needed per orders.) Restrictions Weight Bearing Restrictions: No     Mobility  Bed Mobility Overal bed mobility: Needs Assistance Bed Mobility: Rolling, Sidelying to Sit Rolling: Min guard Sidelying to sit: Min guard     Sit to sidelying: Min guard General bed mobility comments: Increased time but pt able to transition to EOB without assistance. Good log roll technique.    Transfers Overall transfer level: Needs assistance Equipment used: Rolling walker (2 wheels) Transfers: Sit to/from Stand Sit to Stand: Min guard           General transfer comment: Pt demonstrated good hand placement on seated surface for safety. No  assist required.    Ambulation/Gait Ambulation/Gait assistance: Min guard Gait Distance (Feet): 150 Feet Assistive device: Rolling walker (2 wheels) Gait Pattern/deviations: Step-through pattern, Decreased stride length, Trunk flexed Gait velocity: Decreased Gait velocity interpretation: 1.31 - 2.62 ft/sec, indicative of limited community ambulator   General Gait Details: VC's for improved posture, closer walker proximity and forward gaze. No overt LOB noted.   Stairs Stairs: Yes Stairs assistance: Min guard Stair Management: Two rails, Step to pattern, Forwards Number of Stairs: 5 General stair comments: Pt has a ramp at home but was motivated for stair training in case she went somewhere she would need to negotiate stairs. Husband present for education. Pt was cued for optimal safety and appropriate sequencing.   Wheelchair Mobility     Tilt Bed    Modified Rankin (Stroke Patients Only)       Balance Overall balance assessment: Mild deficits observed, not formally tested                                          Cognition Arousal/Alertness: Awake/alert Behavior During Therapy: WFL for tasks assessed/performed Overall Cognitive Status: Within Functional Limits for tasks assessed                                          Exercises General Exercises - Lower Extremity Ankle Circles/Pumps: 10 reps, Supine Gluteal Sets: 10 reps, Standing Short Arc Quad: 10 reps, Supine (3" hold) Long Arc Quad: 10 reps, Seated (3" hold) Other  Exercises Other Exercises: supine abdominal bracing x10 with 3" hold    General Comments        Pertinent Vitals/Pain Pain Assessment Pain Assessment: Faces Faces Pain Scale: Hurts a little bit Pain Location: back Pain Descriptors / Indicators: Operative site guarding Pain Intervention(s): Monitored during session, Limited activity within patient's tolerance, Repositioned    Home Living Family/patient  expects to be discharged to:: Private residence Living Arrangements: Spouse/significant other Available Help at Discharge: Family Type of Home: Mobile home Home Access: Stairs to enter Entrance Stairs-Rails: Can reach both Entrance Stairs-Number of Steps: 2   Home Layout: One level Home Equipment: Agricultural consultant (2 wheels);Cane - single point;Shower seat;BSC/3in1;Grab bars - tub/shower;Hand held shower head;Adaptive equipment Additional Comments: lift chair and has been sleeping in lift chair. pt reports spouse pulls her up by her pants when she is at doctors office chairs due to inability to power up from the surfaces. x2 dogs ( molly and [place]) injury to back started by bending over to care for dogs. Spouse now cares for the dogs solely. There is an outside dog but she does not interact with that dog    Prior Function            PT Goals (current goals can now be found in the care plan section) Acute Rehab PT Goals Patient Stated Goal: Increase strength PT Goal Formulation: With patient/family Time For Goal Achievement: 01/01/23 Potential to Achieve Goals: Good Progress towards PT goals: Progressing toward goals    Frequency    Min 1X/week      PT Plan Current plan remains appropriate    Co-evaluation              AM-PAC PT "6 Clicks" Mobility   Outcome Measure  Help needed turning from your back to your side while in a flat bed without using bedrails?: None Help needed moving from lying on your back to sitting on the side of a flat bed without using bedrails?: A Little Help needed moving to and from a bed to a chair (including a wheelchair)?: A Little Help needed standing up from a chair using your arms (e.g., wheelchair or bedside chair)?: A Little Help needed to walk in hospital room?: A Little Help needed climbing 3-5 steps with a railing? : A Little 6 Click Score: 19    End of Session Equipment Utilized During Treatment: Gait belt Activity Tolerance:  Patient tolerated treatment well Patient left: in bed;with call bell/phone within reach;with family/visitor present Nurse Communication: Mobility status PT Visit Diagnosis: Unsteadiness on feet (R26.81);Pain Pain - part of body:  (back)     Time: 1120-1130 PT Time Calculation (min) (ACUTE ONLY): 10 min  Charges:    $Gait Training: 8-22 mins PT General Charges $$ ACUTE PT VISIT: 1 Visit                     Conni Slipper, PT, DPT Acute Rehabilitation Services Secure Chat Preferred Office: [phone]    Hailey Stanton 12/25/2022, 12:16 PM

## 2022-12-25 NOTE — Consult Note (Signed)
WOC Nurse Consult Note: Reason for Consult:Patient had surgery yesterday and asked to consult for skin tears to bilateral arms and right breast.  Wound type: trauma  paper thin skin.  Patient states she has a long history of fragile , thin skin and gets skin tears often. Patient and husband describe using nonstick dressings at home when needed.  Pressure Injury POA: NA Measurement: Left forearm:  2 linear skin tears with edges approximated Right forearm:  circular skin tear with skin defect noted.   Right arm above wrist:  1 cm linear skin tear Wound bed:red and moist Right breast (likely from medical adhesive with EKG pads) 0.3 cm linear skin tear Drainage (amount, consistency, odor) minimal bleeding Periwound: Ecchymosis and very thin skin to arms and legs.  Bruising to both lower legs that patient indicates was from handling during surgery and she bruises easily as well.  Edema noted to both feet, patient states this is chronic and she is on LAsix daily.  Dressing procedure/placement/frequency: Clean skin tears to right breast, bilateral arms with NS and pat dry  Apply Xeroform to woundbed and cover with silicone foam.  Wound care is performed and patient discharging today. No further intervention needed.  She will remove in 2 days, while in shower or her preference and experience with removing dressings.   Will not follow at this time.  Please re-consult if needed.  Mike Gip MSN, RN, FNP-BC CWON Wound, Ostomy, Continence Nurse Outpatient [facility] [phone] Pager [phone]

## 2022-12-25 NOTE — Evaluation (Addendum)
Occupational Therapy Evaluation Patient Details Name: Hailey Stanton MRN: [id number] DOB: 1956/03/22 Today's Date: 12/25/2022   History of Present Illness 67 yo female L L2-3 laminotomy facetectomy PMH COPD, DM anixety, OA of knee arthritis depression kidney stones HTN   Clinical Impression   Patient is s/p L2-4 laminectomy and facetectomy surgery resulting in functional limitations due to the deficits listed below (see OT problem list). Pt at baseline with increased (A) need from spouse due to pain and inability to reach feet. Pt demonstrates decreased activity tolerance with sweating and fatigue dressing at EOB. Pt educated on the need to walk x4 times in the home daily for activity tolerance as her exercise. Pt and spouse to get additional AE to maximize indep with adls.  Patient will benefit from skilled OT acutely to increase independence and safety with ADLS to allow discharge HHOT.       Recommendations for follow up therapy are one component of a multi-disciplinary discharge planning process, led by the attending physician.  Recommendations may be updated based on patient status, additional functional criteria and insurance authorization.   Assistance Recommended at Discharge Set up Supervision/Assistance  Patient can return home with the following A little help with walking and/or transfers;A little help with bathing/dressing/bathroom;Assist for transportation;Assistance with cooking/housework    Functional Status Assessment  Patient has had a recent decline in their functional status and demonstrates the ability to make significant improvements in function in a reasonable and predictable amount of time.  Equipment Recommendations  None recommended by OT    Recommendations for Other Services PT consult     Precautions / Restrictions Precautions Precautions: Back Precaution Comments: back handout provided and reviewed for adls      Mobility Bed Mobility Overal bed mobility:  Needs Assistance Bed Mobility: Rolling, Supine to Sit, Sit to Supine Rolling: Min assist   Supine to sit: Min guard     General bed mobility comments: Pt with education on body alignment and movement to keep back precautions. spouse educated on use of sheet for skin integrity.    Transfers Overall transfer level: Needs assistance Equipment used: Rolling walker (2 wheels) Transfers: Sit to/from Stand Sit to Stand: Min guard           General transfer comment: cues for hand placement to power up      Balance Overall balance assessment: Mild deficits observed, not formally tested                                         ADL either performed or assessed with clinical judgement   ADL Overall ADL's : Needs assistance/impaired Eating/Feeding: Independent   Grooming: Independent   Upper Body Bathing: Supervision/ safety;Adhering to UE precautions;With adaptive equipment   Lower Body Bathing: Supervison/ safety;Adhering to back precautions;Sit to/from stand;With adaptive equipment   Upper Body Dressing : Supervision/safety;Sitting   Lower Body Dressing: Supervision/safety;Sit to/from stand;Adhering to back precautions;With adaptive equipment               Functional mobility during ADLs: Supervision/safety;Rolling walker (2 wheels) General ADL Comments: pt able to complete dressing with AE this session and has AE at home. Handout provided to family for additional AE that could be benefical . pt and spouse considering adaptation to commode for peri care with biget attachment  Back handout provided and reviewed adls in detail. Pt educated on: set an alarm at  night for medication, avoid sitting for long periods of time, correct bed positioning for sleeping, correct sequence for bed mobility, avoiding lifting more than 5 pounds and never wash directly over incision. All education is complete and patient indicates understanding.    Vision Ability to See in  Adequate Light: 0 Adequate Patient Visual Report: No change from baseline Vision Assessment?: No apparent visual deficits     Perception     Praxis      Pertinent Vitals/Pain Pain Assessment Pain Assessment: Faces Faces Pain Scale: Hurts a little bit Pain Location: back Pain Descriptors / Indicators: Operative site guarding Pain Intervention(s): Monitored during session, Premedicated before session, Repositioned     Hand Dominance Right   Extremity/Trunk Assessment Upper Extremity Assessment Upper Extremity Assessment: RUE deficits/detail;LUE deficits/detail RUE Deficits / Details: noted to have skin tears with pending wound care management. Pt advised to use a sheet for bed mobility if needed to prevent any pulling at skin or to take a pillow to elevate surface for chair transfers while skin heals. LUE Deficits / Details: skin tear wounds noted   Lower Extremity Assessment Lower Extremity Assessment: Generalized weakness   Cervical / Trunk Assessment Cervical / Trunk Assessment: Back Surgery   Communication Communication Communication: No difficulties   Cognition Arousal/Alertness: Awake/alert Behavior During Therapy: WFL for tasks assessed/performed Overall Cognitive Status: Within Functional Limits for tasks assessed                                       General Comments  wounds on bil UE , R lateral rib cage area and LLE with pending wound care consult. Pt advised to use clean linen each shower and wash around cuts/ incision.    Exercises     Shoulder Instructions      Home Living Family/patient expects to be discharged to:: Private residence Living Arrangements: Spouse/significant other Available Help at Discharge: Family Type of Home: Mobile home Home Access: Stairs to enter Entrance Stairs-Number of Steps: 2 Entrance Stairs-Rails: Can reach both Home Layout: One level     Bathroom Shower/Tub: Producer, television/film/video:  Standard     Home Equipment: Agricultural consultant (2 wheels);Cane - single point;Shower seat;BSC/3in1;Grab bars - tub/shower;Hand held shower head;Adaptive equipment Adaptive Equipment: Reacher Additional Comments: lift chair and has been sleeping in lift chair. pt reports spouse pulls her up by her pants when she is at doctors office chairs due to inability to power up from the surfaces. x2 dogs ( molly and [place]) injury to back started by bending over to care for dogs. Spouse now cares for the dogs solely. There is an outside dog but she does not interact with that dog      Prior Functioning/Environment Prior Level of Function : Needs assist       Physical Assist : Mobility (physical);ADLs (physical) Mobility (physical): Transfers;Stairs ADLs (physical): Bathing;Dressing;Toileting;IADLs Mobility Comments: uses a cane but has additional DME ADLs Comments: needs (A) with LB dressing, help with hygiene and iadls        OT Problem List: Decreased strength;Decreased activity tolerance;Impaired balance (sitting and/or standing);Decreased knowledge of use of DME or AE;Decreased knowledge of precautions;Obesity;Pain      OT Treatment/Interventions: Self-care/ADL training;Therapeutic exercise;DME and/or AE instruction;Energy conservation;Therapeutic activities;Patient/family education;Balance training    OT Goals(Current goals can be found in the care plan section) Acute Rehab OT Goals Patient Stated Goal: to go home OT  Goal Formulation: With patient Time For Goal Achievement: 01/08/23 Potential to Achieve Goals: Good ADL Goals Pt Will Perform Lower Body Dressing: Independently;sit to/from stand;with adaptive equipment Pt Will Transfer to Toilet: Independently;ambulating;bedside commode (AE) Additional ADL Goal #1: pt will complete sit<>Stand from chair x10 as precursor to [facility] transfers  OT Frequency: Min 1X/week    Co-evaluation              AM-PAC OT "6 Clicks" Daily Activity      Outcome Measure Help from another person eating meals?: None Help from another person taking care of personal grooming?: None Help from another person toileting, which includes using toliet, bedpan, or urinal?: A Little Help from another person bathing (including washing, rinsing, drying)?: A Little Help from another person to put on and taking off regular upper body clothing?: A Little Help from another person to put on and taking off regular lower body clothing?: A Little 6 Click Score: 20   End of Session Equipment Utilized During Treatment: Rolling walker (2 wheels);Gait belt Nurse Communication: Mobility status;Precautions  Activity Tolerance: Patient tolerated treatment well Patient left: in bed;with call bell/phone within reach;with family/visitor present;with nursing/sitter in room  OT Visit Diagnosis: Unsteadiness on feet (R26.81);Muscle weakness (generalized) (M62.81)                Time: 1610-9604 OT Time Calculation (min): 40 min Charges:  OT General Charges $OT Visit: 1 Visit OT Evaluation $OT Eval Moderate Complexity: 1 Mod OT Treatments $Self Care/Home Management : 8-22 mins   Brynn, OTR/L  Acute Rehabilitation Services Office: [phone] .   Mateo Flow 12/25/2022, 10:26 AM

## 2022-12-25 NOTE — Discharge Summary (Signed)
Patient ID: Hailey Stanton MRN: [id number] DOB/AGE: 1955/09/15 67 y.o.   Admit date: 12/24/2022 Discharge date: 12/25/2022   Admission Diagnoses: L2-3 herniated nucleus pulposus   Discharge Diagnoses: Same     Discharged Condition: Stable   Hospital Course:  Hailey Stanton is a 67 y.o. female who was admitted following an uncomplicated L2-3 L microdiscectomy. While positioning patient/transporting to PACU patient obtained skin tears to b/l forearms and to R breast. They were dressed in sterile gauze and wound care consult was placed. They were recovered in PACU and transferred to [facility]. Hospital course was uncomplicated. Pt stable for discharge today after wound care consult complete. Pt to f/u in office for routine post op visit. Pt is in agreement w/ plan. She will f/u w/ PCP for low K and Mg levels.      Discharge Exam: Blood pressure (!) 150/75, pulse 96, temperature 98.4 F (36.9 C), temperature source Oral, resp. rate 16, height 5\' 4"  (1.626 m), weight 106.6 kg, SpO2 97%. A&O x3 Speech fluent, appropriate CN grossly intact Strength 5/5 x4.  SILTx4.  Dressing c/d/I.   Disposition: Discharge disposition: 01-Home or Self Care       Discharge Instructions     Incentive spirometry RT   Complete by: As directed       Allergies as of 12/25/2022       Reactions   Cefadroxil Anaphylaxis, Rash   Cephalexin Anaphylaxis, Hives   Moxifloxacin Hcl In Nacl Hives, Other (See Comments)        Medication List     TAKE these medications    atorvastatin 20 MG tablet Commonly known as: LIPITOR Take 1 tablet (20 mg total) by mouth daily.   celecoxib 200 MG capsule Commonly known as: CELEBREX Take 1 capsule (200 mg total) by mouth in the morning. Start taking on: December 31, 2022 What changed: These instructions start on December 31, 2022. If you are unsure what to do until then, ask your doctor or other care provider.   diltiazem 30 MG tablet Commonly known as: CARDIZEM Take  30 mg by mouth daily as needed (HR higher than 120).   diltiazem 360 MG 24 hr capsule Commonly known as: Cardizem CD Take 1 capsule (360 mg total) by mouth daily.   Eliquis 5 MG Tabs tablet Generic drug: apixaban Take 1 tablet (5 mg total) by mouth 2 (two) times daily. Start taking on: December 31, 2022 What changed: These instructions start on December 31, 2022. If you are unsure what to do until then, ask your doctor or other care provider.   flecainide 100 MG tablet Commonly known as: TAMBOCOR Take 1 tablet (100 mg total) by mouth 2 (two) times daily.   furosemide 20 MG tablet Commonly known as: LASIX Take 20 mg by mouth 2 (two) times daily.   glimepiride 4 MG tablet Commonly known as: AMARYL Take 4 mg by mouth daily with breakfast.   HYDROcodone-acetaminophen 5-325 MG tablet Commonly known as: NORCO/VICODIN Take 1 tablet by mouth every 6 (six) hours as needed (pain.).   insulin glargine 100 UNIT/ML injection Commonly known as: LANTUS Inject 30 Units into the skin at bedtime.   metFORMIN 850 MG tablet Commonly known as: GLUCOPHAGE Take 850 mg by mouth 2 (two) times daily with a meal.   methocarbamol 500 MG tablet Commonly known as: ROBAXIN Take 1 tablet (500 mg total) by mouth every 6 (six) hours as needed for muscle spasms.   omeprazole 20 MG capsule Commonly known as:  PRILOSEC Take 20 mg by mouth in the morning.   Ozempic (1 MG/DOSE) 4 MG/3ML Sopn Generic drug: Semaglutide (1 MG/DOSE) Inject 1 mg into the skin every Monday.   traMADol 50 MG tablet Commonly known as: ULTRAM Take 50 mg by mouth 2 (two) times daily as needed (pain.).   valsartan-hydrochlorothiazide 320-25 MG tablet Commonly known as: DIOVAN-HCT Take 1 tablet by mouth in the morning.         Signed: Clovis Riley 12/25/2022, 12:01 PM

## 2022-12-25 NOTE — Plan of Care (Signed)
Pt doing well. Pt and husband given D/C instructions with verbal understanding. Rx was sent to the pharmacy by MD. Pt's incision is clean and dry with no sign of infection. Pt's IV was removed prior to D/C. Pt D/C'd home via wheelchair per MD order. Pt is stable @ D/C and has no other needs at this time. Ashley Allred, RN  

## 2022-12-25 NOTE — Inpatient Diabetes Management (Signed)
Inpatient Diabetes Program Recommendations  AACE/ADA: New Consensus Statement on Inpatient Glycemic Control   Target Ranges:  Prepandial:   less than 140 mg/dL      Peak postprandial:   less than 180 mg/dL (1-2 hours)      Critically ill patients:  140 - 180 mg/dL    Latest Reference Range & Units 12/24/22 11:56 12/24/22 14:06 12/24/22 17:24 12/24/22 20:49 12/24/22 20:50 12/25/22 00:22 12/25/22 05:59  Glucose-Capillary 70 - 99 mg/dL 161 (H) 096 (H) 045 (H) 422 (H) 418 (H) 317 (H) 273 (H)    Latest Reference Range & Units 12/24/22 20:54  Hemoglobin A1C 4.8 - 5.6 % 7.0 (H)   Review of Glycemic Control  Diabetes history: DM2 Outpatient Diabetes medications: Lantus 30 units at bedtime, Amaryl 4 mg QAM, Metformin 850 mg BID, Ozempic 1 mg QWeek Current orders for Inpatient glycemic control: Semglee 30 units at bedtime, Novolog 0-15 units TID with meals, Novolog 0-5 units at bedtime, Metformin 850 mg BID, Amaryl 4 mg QAM  NOTE: Patient had surgery on 12/24/22 and received Decadron 5 mg at 16:15 and Solumedrol 80 mg at 16:31 on 12/24/22. Steroids are contributing to hyperglycemia. Fasting CBG 273 mg/dl today and patient has received Novolog correction, Amaryl, and Metformin this morning. No other steroids ordered currently. No recommendations at this time. Will follow along while inpatient and make recommendations as more data on glycemic trends is collected.  Thanks, Orlando Penner, RN, MSN, CDCES Diabetes Coordinator Inpatient Diabetes Program [phone] (Team Pager from 8am to 5pm)

## 2022-12-25 NOTE — Evaluation (Signed)
Physical Therapy Evaluation  Patient Details Name: Hailey Stanton MRN: [id number] DOB: [birth year] Today's Date: 12/25/2022  History of Present Illness  Pt is a 67 y/o female who presents s/p L L2-3 laminotomy facetectomy on 12/24/2022. PMH significant for COPD, DM, anixety, OA of knee, depression, kidney stones, HTN.  Clinical Impression  Pt admitted with above diagnosis. At the time of PT eval, pt was able to demonstrate transfers and ambulation with gross min guard assist to min assist and RW for support. Pt with LE weakness and functional decline from baseline. Instructed in basic exercise program to start improving abdominal, quad, and glute strength. Pt was educated on precautions, brace application/wearing schedule, appropriate activity progression, and car transfer. Pt currently with functional limitations due to the deficits listed below (see PT Problem List). Pt will benefit from skilled PT to increase their independence and safety with mobility to allow discharge to the venue listed below.          Assistance Recommended at Discharge Intermittent Supervision/Assistance  If plan is discharge home, recommend the following:  Can travel by private vehicle  A little help with walking and/or transfers;A little help with bathing/dressing/bathroom;Assistance with cooking/housework;Assist for transportation;Help with stairs or ramp for entrance        Equipment Recommendations None recommended by PT  Recommendations for Other Services       Functional Status Assessment Patient has had a recent decline in their functional status and demonstrates the ability to make significant improvements in function in a reasonable and predictable amount of time.     Precautions / Restrictions Precautions Precautions: Back;Fall Precaution Booklet Issued: Yes (comment) Precaution Comments: Reviewed handout and pt was cued for precautions during functional mobility. Spinal Brace:  (No brace needed per  orders.) Restrictions Weight Bearing Restrictions: No      Mobility  Bed Mobility Overal bed mobility: Needs Assistance Bed Mobility: Rolling, Sidelying to Sit, Sit to Sidelying Rolling: Min guard Sidelying to sit: Min assist     Sit to sidelying: Min guard General bed mobility comments: HOB flat and rails lowered to simulate home environment. Pt was able to perform log roll with VC's for sequencing and assist for trunk elevation to full sitting position. Pt was able to elevate LE's back into bed at end of session without assist.    Transfers Overall transfer level: Needs assistance Equipment used: Rolling walker (2 wheels) Transfers: Sit to/from Stand Sit to Stand: Min guard           General transfer comment: VC's for hand placement on seated surface for safety.    Ambulation/Gait Ambulation/Gait assistance: Min guard Gait Distance (Feet): 200 Feet Assistive device: Rolling walker (2 wheels) Gait Pattern/deviations: Step-through pattern, Decreased stride length, Trunk flexed Gait velocity: Decreased Gait velocity interpretation: 1.31 - 2.62 ft/sec, indicative of limited community ambulator   General Gait Details: VC's for improved posture, closer walker proximity and forward gaze. No overt LOB noted.  Stairs            Wheelchair Mobility     Tilt Bed    Modified Rankin (Stroke Patients Only)       Balance Overall balance assessment: Mild deficits observed, not formally tested                                           Pertinent Vitals/Pain Pain Assessment Pain Assessment: Faces Faces Pain  Scale: Hurts a little bit Pain Location: back Pain Descriptors / Indicators: Operative site guarding Pain Intervention(s): Limited activity within patient's tolerance, Monitored during session, Repositioned    Home Living Family/patient expects to be discharged to:: Private residence Living Arrangements: Spouse/significant other Available  Help at Discharge: Family Type of Home: Mobile home Home Access: Stairs to enter Entrance Stairs-Rails: Can reach both Entrance Stairs-Number of Steps: 2   Home Layout: One level Home Equipment: Agricultural consultant (2 wheels);Cane - single point;Shower seat;BSC/3in1;Grab bars - tub/shower;Hand held shower head;Adaptive equipment Additional Comments: lift chair and has been sleeping in lift chair. pt reports spouse pulls her up by her pants when she is at doctors office chairs due to inability to power up from the surfaces. x2 dogs ( molly and [place]) injury to back started by bending over to care for dogs. Spouse now cares for the dogs solely. There is an outside dog but she does not interact with that dog    Prior Function Prior Level of Function : Needs assist       Physical Assist : Mobility (physical);ADLs (physical) Mobility (physical): Transfers;Stairs ADLs (physical): Bathing;Dressing;Toileting;IADLs Mobility Comments: uses a cane but has additional DME ADLs Comments: needs (A) with LB dressing, help with hygiene and iadls     Hand Dominance   Dominant Hand: Right    Extremity/Trunk Assessment   Upper Extremity Assessment Upper Extremity Assessment: Defer to OT evaluation    Lower Extremity Assessment Lower Extremity Assessment: Generalized weakness    Cervical / Trunk Assessment Cervical / Trunk Assessment: Back Surgery  Communication   Communication: No difficulties  Cognition Arousal/Alertness: Awake/alert Behavior During Therapy: WFL for tasks assessed/performed Overall Cognitive Status: Within Functional Limits for tasks assessed                                          General Comments      Exercises General Exercises - Lower Extremity Ankle Circles/Pumps: 10 reps, Supine Gluteal Sets: 10 reps, Standing Short Arc Quad: 10 reps, Supine (3" hold) Long Arc Quad: 10 reps, Seated (3" hold) Other Exercises Other Exercises: supine abdominal  bracing x10 with 3" hold   Assessment/Plan    PT Assessment Patient needs continued PT services  PT Problem List Decreased strength;Decreased activity tolerance;Decreased balance;Decreased mobility;Decreased knowledge of use of DME;Decreased safety awareness;Pain;Decreased knowledge of precautions       PT Treatment Interventions DME instruction;Gait training;Stair training;Functional mobility training;Therapeutic activities;Therapeutic exercise;Balance training;Patient/family education    PT Goals (Current goals can be found in the Care Plan section)  Acute Rehab PT Goals Patient Stated Goal: Increase strength PT Goal Formulation: With patient/family Time For Goal Achievement: 01/01/23 Potential to Achieve Goals: Good    Frequency Min 1X/week     Co-evaluation               AM-PAC PT "6 Clicks" Mobility  Outcome Measure Help needed turning from your back to your side while in a flat bed without using bedrails?: None Help needed moving from lying on your back to sitting on the side of a flat bed without using bedrails?: A Little Help needed moving to and from a bed to a chair (including a wheelchair)?: A Little Help needed standing up from a chair using your arms (e.g., wheelchair or bedside chair)?: A Little Help needed to walk in hospital room?: A Little Help needed climbing 3-5 steps with  a railing? : A Little 6 Click Score: 19    End of Session Equipment Utilized During Treatment: Gait belt Activity Tolerance: Patient tolerated treatment well Patient left: in bed;with call bell/phone within reach;with family/visitor present Nurse Communication: Mobility status PT Visit Diagnosis: Unsteadiness on feet (R26.81);Pain Pain - part of body:  (back)    Time: 6295-2841 PT Time Calculation (min) (ACUTE ONLY): 26 min   Charges:   PT Evaluation $PT Eval Low Complexity: 1 Low PT Treatments $Gait Training: 8-22 mins PT General Charges $$ ACUTE PT VISIT: 1 Visit          Conni Slipper, PT, DPT Acute Rehabilitation Services Secure Chat Preferred Office: [phone]   Marylynn Pearson 12/25/2022, 12:10 PM

## 2022-12-26 NOTE — Anesthesia Postprocedure Evaluation (Signed)
Anesthesia Post Note  Patient: Hailey Stanton  Procedure(s) Performed: MIS MICRODISCECTOMY, L23 (Left: Spine Lumbar)     Patient location during evaluation: PACU Anesthesia Type: General Level of consciousness: awake and alert Pain management: pain level controlled Vital Signs Assessment: post-procedure vital signs reviewed and stable Respiratory status: spontaneous breathing, nonlabored ventilation, respiratory function stable and patient connected to nasal cannula oxygen Cardiovascular status: blood pressure returned to baseline and stable Postop Assessment: no apparent nausea or vomiting Anesthetic complications: no   There were no known notable events for this encounter.  Last Vitals:  Vitals:   12/25/22 0422 12/25/22 0747  BP: (!) 146/75 (!) 150/75  Pulse: 91 96  Resp: 18 16  Temp: 36.9 C 36.9 C  SpO2: 96% 97%    Last Pain:  Vitals:   12/25/22 1605  TempSrc:   PainSc: 6                  Kojo Liby P Shyvonne Chastang

## 2023-03-18 ENCOUNTER — Other Ambulatory Visit (HOSPITAL_COMMUNITY): Payer: Self-pay | Admitting: Orthopedic Surgery

## 2023-03-18 ENCOUNTER — Ambulatory Visit (HOSPITAL_COMMUNITY)
Admission: RE | Admit: 2023-03-18 | Discharge: 2023-03-18 | Disposition: A | Discharge status: Home / Self Care | Payer: PPO | Source: Ambulatory Visit | Attending: Cardiology | Admitting: Cardiology

## 2023-03-18 DIAGNOSIS — M79672 Pain in left foot: Secondary | ICD-10-CM

## 2023-03-18 DIAGNOSIS — M79605 Pain in left leg: Secondary | ICD-10-CM

## 2023-03-18 DIAGNOSIS — M25572 Pain in left ankle and joints of left foot: Secondary | ICD-10-CM | POA: Insufficient documentation

## 2023-03-25 ENCOUNTER — Telehealth: Payer: Self-pay

## 2023-03-25 NOTE — Telephone Encounter (Signed)
   [facility] Pre-operative Risk Assessment    Request for surgical clearance:  What type of surgery is being performed? Right L4-5 ESI   When is this surgery scheduled? TBD   What type of clearance is required (medical clearance vs. Pharmacy clearance to hold med vs. Both)? Pharmacy  Are there any medications that need to be held prior to surgery and how long?Eliquis hold 3 days prior and resume the day after procedure.    Practice name and name of physician performing surgery? Dr. Aileen Fass at [facility]   What is your office phone number: [phone] ext: [phone]    7.   What is your office fax number: [phone]  8.   Anesthesia type (None, local, MAC, general) ? Not specified   Tiburcio Pea Bertrand Vowels 03/25/2023, 5:29 PM  _________________________________________________________________   (provider comments below)

## 2023-03-26 ENCOUNTER — Other Ambulatory Visit: Payer: Self-pay | Admitting: Internal Medicine

## 2023-03-26 ENCOUNTER — Telehealth: Payer: Self-pay | Admitting: *Deleted

## 2023-03-26 DIAGNOSIS — Z1231 Encounter for screening mammogram for malignant neoplasm of breast: Secondary | ICD-10-CM

## 2023-03-26 NOTE — Telephone Encounter (Signed)
   Name: Hailey Stanton  DOB: 1955/08/27  MRN: [id number]  Primary Cardiologist: None   Preoperative team, please contact this patient and set up a phone call appointment for further preoperative risk assessment. Please obtain consent and complete medication review. Thank you for your help.  I confirm that guidance regarding antiplatelet and oral anticoagulation therapy has been completed and, if necessary, noted below.  Per office protocol, patient can hold Eliquis for 3 days prior to procedure.   I also confirmed the patient resides in the state of [place]. As per [facility] Medical Board telemedicine laws, the patient must reside in the state in which the provider is licensed.   Ronney Asters, NP 03/26/2023, 9:38 AM [facility]

## 2023-03-26 NOTE — Telephone Encounter (Signed)
Patient with diagnosis of afib and DVT on Eliquis for anticoagulation.    Procedure: Right L4-5 ESI  Date of procedure: TBD  CHA2DS2-VASc Score = 4  This indicates a 4.8% annual risk of stroke. The patient's score is based upon: CHF History: 0 HTN History: 1 Diabetes History: 1 Stroke History: 0 Vascular Disease History: 0 Age Score: 1 Gender Score: 1   Provoked DVT in 2000.    CrCl 73mL/min using adjusted body weight due to obesity Platelet count 275K  Per office protocol, patient can hold Eliquis for 3 days prior to procedure.    **This guidance is not considered finalized until pre-operative APP has relayed final recommendations.**

## 2023-03-26 NOTE — Telephone Encounter (Signed)
PRIMARY CARD IS DR. Bing Matter.   Pt has been scheduled tele pre op appt 04/08/23. Med rec and consent are done.

## 2023-03-26 NOTE — Telephone Encounter (Signed)
Pt has been scheduled tele pre op appt 04/08/23. Med rec and consent are done.     Patient Consent for Virtual Visit        Hailey Stanton has provided verbal consent on 03/26/2023 for a virtual visit (video or telephone).   CONSENT FOR VIRTUAL VISIT FOR:  Hailey Stanton  By participating in this virtual visit I agree to the following:  I hereby voluntarily request, consent and authorize [facility] and its employed or contracted physicians, physician assistants, nurse practitioners or other licensed health care professionals (the Practitioner), to provide me with telemedicine health care services (the "Services") as deemed necessary by the treating Practitioner. I acknowledge and consent to receive the Services by the Practitioner via telemedicine. I understand that the telemedicine visit will involve communicating with the Practitioner through live audiovisual communication technology and the disclosure of certain medical information by electronic transmission. I acknowledge that I have been given the opportunity to request an in-person assessment or other available alternative prior to the telemedicine visit and am voluntarily participating in the telemedicine visit.  I understand that I have the right to withhold or withdraw my consent to the use of telemedicine in the course of my care at any time, without affecting my right to future care or treatment, and that the Practitioner or I may terminate the telemedicine visit at any time. I understand that I have the right to inspect all information obtained and/or recorded in the course of the telemedicine visit and may receive copies of available information for a reasonable fee.  I understand that some of the potential risks of receiving the Services via telemedicine include:  Delay or interruption in medical evaluation due to technological equipment failure or disruption; Information transmitted may not be sufficient (e.g. poor  resolution of images) to allow for appropriate medical decision making by the Practitioner; and/or  In rare instances, security protocols could fail, causing a breach of personal health information.  Furthermore, I acknowledge that it is my responsibility to provide information about my medical history, conditions and care that is complete and accurate to the best of my ability. I acknowledge that Practitioner's advice, recommendations, and/or decision may be based on factors not within their control, such as incomplete or inaccurate data provided by me or distortions of diagnostic images or specimens that may result from electronic transmissions. I understand that the practice of medicine is not an exact science and that Practitioner makes no warranties or guarantees regarding treatment outcomes. I acknowledge that a copy of this consent can be made available to me via my patient portal [facility] MyChart), or I can request a printed copy by calling the office of [facility].    I understand that my insurance will be billed for this visit.   I have read or had this consent read to me. I understand the contents of this consent, which adequately explains the benefits and risks of the Services being provided via telemedicine.  I have been provided ample opportunity to ask questions regarding this consent and the Services and have had my questions answered to my satisfaction. I give my informed consent for the services to be provided through the use of telemedicine in my medical care

## 2023-04-04 NOTE — Telephone Encounter (Signed)
Dr. Kathlene Cote office notified of the follow ing appt for the requested clearance.

## 2023-04-08 ENCOUNTER — Ambulatory Visit: Payer: PPO | Attending: Cardiovascular Disease | Admitting: Student

## 2023-04-08 DIAGNOSIS — Z01810 Encounter for preprocedural cardiovascular examination: Secondary | ICD-10-CM
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
## 2023-04-18 ENCOUNTER — Ambulatory Visit
Admission: RE | Admit: 2023-04-18 | Discharge: 2023-04-18 | Disposition: A | Discharge status: Home / Self Care | Payer: PPO | Source: Ambulatory Visit | Attending: Internal Medicine | Admitting: Internal Medicine

## 2023-04-18 DIAGNOSIS — Z1231 Encounter for screening mammogram for malignant neoplasm of breast: Secondary | ICD-10-CM

## 2023-05-23 ENCOUNTER — Encounter: Payer: Self-pay | Admitting: Cardiology

## 2023-05-23 ENCOUNTER — Ambulatory Visit: Payer: PPO | Attending: Cardiology | Admitting: Cardiology

## 2023-05-23 VITALS — BP 148/68 | HR 78 | Ht 64.0 in | Wt 224.4 lb

## 2023-05-23 DIAGNOSIS — M5416 Radiculopathy, lumbar region: Secondary | ICD-10-CM | POA: Diagnosis not present

## 2023-05-23 DIAGNOSIS — I480 Paroxysmal atrial fibrillation: Secondary | ICD-10-CM | POA: Diagnosis not present

## 2023-05-23 DIAGNOSIS — J410 Simple chronic bronchitis: Secondary | ICD-10-CM | POA: Diagnosis not present

## 2023-05-23 DIAGNOSIS — I10 Essential (primary) hypertension: Secondary | ICD-10-CM

## 2023-05-23 MED ORDER — FLECAINIDE ACETATE 100 MG PO TABS: 50.0000 mg | ORAL_TABLET | Freq: Two times a day (BID) | ORAL | 6 refills | Status: DC

## 2023-05-23 NOTE — Patient Instructions (Signed)
Medication Instructions:   DECREASE: Flecainide to 50mg  twice daily   Lab Work: None Ordered If you have labs (blood work) drawn today and your tests are completely normal, you will receive your results only by: MyChart Message (if you have MyChart) OR A paper copy in the mail If you have any lab test that is abnormal or we need to change your treatment, we will call you to review the results.   Testing/Procedures: None Ordered   Follow-Up: At [facility], you and your health needs are our priority.  As part of our continuing mission to provide you with exceptional heart care, we have created designated Provider Care Teams.  These Care Teams include your primary Cardiologist (physician) and Advanced Practice Providers (APPs -  Physician Assistants and Nurse Practitioners) who all work together to provide you with the care you need, when you need it.  We recommend signing up for the patient portal called "MyChart".  Sign up information is provided on this After Visit Summary.  MyChart is used to connect with patients for Virtual Visits (Telemedicine).  Patients are able to view lab/test results, encounter notes, upcoming appointments, etc.  Non-urgent messages can be sent to your provider as well.   To learn more about what you can do with MyChart, go to [email or web address].    Your next appointment:   6 month(s)  The format for your next appointment:   In Person  Provider:   Gypsy Balsam, MD    Other Instructions NA
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
## 2023-06-19 ENCOUNTER — Telehealth: Payer: Self-pay | Admitting: *Deleted

## 2023-06-19 NOTE — Telephone Encounter (Signed)
   Pre-operative Risk Assessment    Patient Name: Hailey Stanton  DOB: May 27, 1956 MRN: [id number]      Request for Surgical Clearance    Procedure:   Colonoscopy  Date of Surgery:  Clearance 07/16/23                                 Surgeon:  Dr. Marcial Pacas Misenheimer Surgeon's Group or Practice Name:  [facility] Phone number:  [phone] Fax number:  [phone]   Type of Clearance Requested:   - Pharmacy:  Hold Apixaban (Eliquis) Is it okay to stop   Type of Anesthesia:  Not Indicated   Additional requests/questions:    Lynne Logan   06/19/2023, 11:49 AM

## 2023-06-20 NOTE — Telephone Encounter (Signed)
Pharmacy please advise on holding Eliquis prior to colonoscopy scheduled for 07/16/2023. Thank you.

## 2023-06-24 NOTE — Telephone Encounter (Signed)
Patient with diagnosis of Afib plus DVT on Eliquis for anticoagulation.    Procedure: colonoscopy Date of procedure: 07/16/23   CHA2DS2-VASc Score = 5  This indicates a 7.2% annual risk of stroke. The patient's score is based upon: CHF History: 0 HTN History: 1 Diabetes History: 1 Stroke History: 0 Vascular Disease History: 1 Age Score: 1 Gender Score: 1   CrCl 63 mL/min using adj body weight Platelet count 350K   Per office protocol, patient can hold Eliquis for 1 day prior to procedure  **This guidance is not considered finalized until pre-operative APP has relayed final recommendations.**

## 2023-06-24 NOTE — Telephone Encounter (Signed)
   Patient Name: Hailey Stanton  DOB: 22-Oct-1955 MRN: [id number]  Primary Cardiologist: Gypsy Balsam, MD  Clinical pharmacists have reviewed the patient's past medical history, labs, and current medications as part of preoperative protocol coverage. The following recommendations have been made:   Patient with diagnosis of Afib plus DVT on Eliquis for anticoagulation.     Procedure: colonoscopy Date of procedure: 07/16/23     CHA2DS2-VASc Score = 5  This indicates a 7.2% annual risk of stroke. The patient's score is based upon: CHF History: 0 HTN History: 1 Diabetes History: 1 Stroke History: 0 Vascular Disease History: 1 Age Score: 1 Gender Score: 1     CrCl 63 mL/min using adj body weight Platelet count 350K     Per office protocol, patient can hold Eliquis for 1 day prior to procedure. Please resume Eliquis as soon as possible postprocedure, at the discretion of the surgeon.   I will route this recommendation to the requesting party via Epic fax function and remove from pre-op pool.  Please call with questions.  Joylene Grapes, NP 06/24/2023, 12:42 PM

## 2023-09-02 ENCOUNTER — Telehealth: Payer: Self-pay

## 2023-09-02 NOTE — Telephone Encounter (Signed)
   Pre-operative Risk Assessment    Patient Name: Hailey Stanton  DOB: 31-Jan-1956 MRN: [id number]   Date of last office visit: 05/23/2023 Date of next office visit: N/A   Request for Surgical Clearance  Procedure:   Cataract extraction w/ intraocular Lens implantation on bilateral eyes ( separate dates)  Date of Surgery:  Clearance 11/20/23   and 12/09/2023                            Surgeon:  Dr. Fredderick Phenix  Surgeon's Group or Practice Name:  [facility]  Phone number:  N?[phone]  Fax number:  [phone]  Type of Clearance Requested:   Medical and Pharmacy.    Type of Anesthesia:   Not specified Additional requests/questions:  Please advise surgeon/provider what medications should be held.  Hailey Stanton   09/02/2023, 3:10 PM

## 2023-09-03 NOTE — Telephone Encounter (Signed)
   Patient Name: Hailey Stanton  DOB: Feb 26, 1956 MRN: [id number]  Primary Cardiologist: Gypsy Balsam, MD  Chart reviewed as part of pre-operative protocol coverage. Cataract extractions are recognized in guidelines as low risk surgeries that do not typically require specific preoperative testing or holding of blood thinner therapy. Therefore, given past medical history and time since last visit, based on ACC/AHA guidelines, Hailey Stanton would be at acceptable risk for the planned procedure without further cardiovascular testing.   I will route this recommendation to the requesting party via Epic fax function and remove from pre-op pool.  Please call with questions.  Denyce Robert, NP 09/03/2023, 8:42 AM

## 2023-09-04 ENCOUNTER — Telehealth: Payer: Self-pay | Admitting: *Deleted

## 2023-09-04 NOTE — Telephone Encounter (Signed)
 Patient with diagnosis of atrial fibrillation on Eliquis for anticoagulation.    Procedure:   LEFT REVERSE TOTAL SHOULDER DUE TO HUMERUS Fx   Date of Surgery:  Clearance 09/10/23     CHA2DS2-VASc Score = 5   This indicates a 7.2% annual risk of stroke. The patient's score is based upon: CHF History: 0 HTN History: 1 Diabetes History: 1 Stroke History: 0 Vascular Disease History: 1 Age Score: 1 Gender Score: 1    CrCl 79 Platelet count 275  Per office protocol, patient can hold Eliquis for 3 days prior to procedure.   Patient will not need bridging with Lovenox (enoxaparin) around procedure.  **This guidance is not considered finalized until pre-operative APP has relayed final recommendations.**

## 2023-09-04 NOTE — Telephone Encounter (Signed)
   Pre-operative Risk Assessment    Patient Name: Hailey Stanton  DOB: 1955-07-11 MRN: [id number]   Date of last office visit: 05/23/23 DR. KRASOWSKI Date of next office visit: NONE   Request for Surgical Clearance    Procedure:   LEFT REVERSE TOTAL SHOULDER DUE TO HUMERUS Fx  Date of Surgery:  Clearance 09/10/23                                Surgeon:  DR. Malon Kindle Surgeon's Group or Practice Name:  [facility] Phone number:  [phone] : [facility] Fax number:  [phone]   Type of Clearance Requested:   - Medical  - Pharmacy:  Hold Apixaban (Eliquis)     Type of Anesthesia:   CHOICE   Additional requests/questions:    Elpidio Anis   09/04/2023, 2:37 PM
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
## 2023-09-04 NOTE — Telephone Encounter (Signed)
 Please advise holding Eliquis prior to left reverse total shoulder due to humerus fracture on 4/9.  Thank you!  DW

## 2023-09-04 NOTE — Telephone Encounter (Signed)
 Pt is scheduled tele preop appt 09/05/23. Med rec and consent are done.

## 2023-09-04 NOTE — Telephone Encounter (Signed)
   Name: Hailey Stanton  DOB: Oct 03, 1955  MRN: [id number]  Primary Cardiologist: Gypsy Balsam, MD   Preoperative team, please contact this patient and set up a phone call appointment for further preoperative risk assessment. Please obtain consent and complete medication review. Thank you for your help.  I confirm that guidance regarding antiplatelet and oral anticoagulation therapy has been completed and, if necessary, noted below.  Awaiting recommendations from Pharmacy for Eliquis hold.   I also confirmed the patient resides in the state of [place]. As per [facility] Medical Board telemedicine laws, the patient must reside in the state in which the provider is licensed.   Carlos Levering, NP 09/04/2023, 2:56 PM [facility]

## 2023-09-04 NOTE — Telephone Encounter (Signed)
 Pt is scheduled tele preop appt 09/05/23. Med rec and consent are done.      Patient Consent for Virtual Visit        Hailey Stanton has provided verbal consent on 09/04/2023 for a virtual visit (video or telephone).   CONSENT FOR VIRTUAL VISIT FOR:  Hailey Stanton  By participating in this virtual visit I agree to the following:  I hereby voluntarily request, consent and authorize [facility] and its employed or contracted physicians, physician assistants, nurse practitioners or other licensed health care professionals (the Practitioner), to provide me with telemedicine health care services (the "Services") as deemed necessary by the treating Practitioner. I acknowledge and consent to receive the Services by the Practitioner via telemedicine. I understand that the telemedicine visit will involve communicating with the Practitioner through live audiovisual communication technology and the disclosure of certain medical information by electronic transmission. I acknowledge that I have been given the opportunity to request an in-person assessment or other available alternative prior to the telemedicine visit and am voluntarily participating in the telemedicine visit.  I understand that I have the right to withhold or withdraw my consent to the use of telemedicine in the course of my care at any time, without affecting my right to future care or treatment, and that the Practitioner or I may terminate the telemedicine visit at any time. I understand that I have the right to inspect all information obtained and/or recorded in the course of the telemedicine visit and may receive copies of available information for a reasonable fee.  I understand that some of the potential risks of receiving the Services via telemedicine include:  Delay or interruption in medical evaluation due to technological equipment failure or disruption; Information transmitted may not be sufficient (e.g. poor resolution of  images) to allow for appropriate medical decision making by the Practitioner; and/or  In rare instances, security protocols could fail, causing a breach of personal health information.  Furthermore, I acknowledge that it is my responsibility to provide information about my medical history, conditions and care that is complete and accurate to the best of my ability. I acknowledge that Practitioner's advice, recommendations, and/or decision may be based on factors not within their control, such as incomplete or inaccurate data provided by me or distortions of diagnostic images or specimens that may result from electronic transmissions. I understand that the practice of medicine is not an exact science and that Practitioner makes no warranties or guarantees regarding treatment outcomes. I acknowledge that a copy of this consent can be made available to me via my patient portal [facility] MyChart), or I can request a printed copy by calling the office of [facility].    I understand that my insurance will be billed for this visit.   I have read or had this consent read to me. I understand the contents of this consent, which adequately explains the benefits and risks of the Services being provided via telemedicine.  I have been provided ample opportunity to ask questions regarding this consent and the Services and have had my questions answered to my satisfaction. I give my informed consent for the services to be provided through the use of telemedicine in my medical care

## 2023-09-05 ENCOUNTER — Ambulatory Visit: Attending: Internal Medicine | Admitting: Emergency Medicine

## 2023-09-05 DIAGNOSIS — Z01810 Encounter for preprocedural cardiovascular examination: Secondary | ICD-10-CM

## 2023-09-05 NOTE — Progress Notes (Addendum)
 COVID Vaccine Completed:  Date of COVID positive in last 90 days: no  PCP - Simone Curia, MD Cardiologist - Gypsy Balsam, MD  Cardiac clearance by Rodrigo Ran, NP 09/05/23 in Epic   Chest x-ray - n/a EKG - 05/23/23 Epic Stress Test - 12/19/21 Epic ECHO - 09/27/22 Epic Cardiac Cath - n/a Pacemaker/ICD device last checked: n/a Spinal Cord Stimulator: n/a  Bowel Prep - no  Sleep Study - n/a CPAP -   Fasting Blood Sugar - 111-212 Checks Blood Sugar 1 times a day  Last dose of GLP1 agonist-  Ozempic, takes Mondays GLP1 instructions:  Hold 7 days before surgery. DO not take 09/08/23   Last dose of SGLT-2 inhibitors-  Jardiance SGLT-2 instructions:  Hold 3 days before surgery. Patient came to appointment 09/08/23. Last dose 09/07/23   Blood Thinner Instructions:  Eliquis, hold 3 days. Per pt she was told to hold 5 or 3 days. She stopped taking on 09/05/23 Aspirin Instructions: Last Dose: 09/04/23 0730  Activity level: Can perform activities of daily living without stopping and without symptoms of chest pain or shortness of breath. Struggling with stairs currently due to knee injury with stitches    Anesthesia review: HTN, a fib, DVT, COPD, DM2, A1C 8.4  Patient denies shortness of breath, fever, cough and chest pain at PAT appointment  Patient verbalized understanding of instructions that were given to them at the PAT appointment. Patient was also instructed that they will need to review over the PAT instructions again at home before surgery.
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
## 2023-09-05 NOTE — Patient Instructions (Addendum)
 SURGICAL WAITING ROOM VISITATION  Patients having surgery or a procedure may have no more than 2 support people in the waiting area - these visitors may rotate.    Children under the age of 47 must have an adult with them who is not the patient.  Due to an increase in RSV and influenza rates and associated hospitalizations, children ages 47 and under may not visit patients in [facility] hospitals.  Visitors with respiratory illnesses are discouraged from visiting and should remain at home.  If the patient needs to stay at the hospital during part of their recovery, the visitor guidelines for inpatient rooms apply. Pre-op nurse will coordinate an appropriate time for 1 support person to accompany patient in pre-op.  This support person may not rotate.    Please refer to the [facility] website for the visitor guidelines for Inpatients (after your surgery is over and you are in a regular room).    Your procedure is scheduled on: 09/10/23   Report to [facility] Main Entrance    Report to admitting at 1:45 PM   Call this number if you have problems the morning of surgery [phone]   Do not eat food :After Midnight.   After Midnight you may have the following liquids until 1:15 PM DAY OF SURGERY  Water Non-Citrus Juices (without pulp, NO RED-Apple, White grape, White cranberry) Black Coffee (NO MILK/CREAM OR CREAMERS, sugar ok)  Clear Tea (NO MILK/CREAM OR CREAMERS, sugar ok) regular and decaf                             Plain Jell-O (NO RED)                                           Fruit ices (not with fruit pulp, NO RED)                                     Popsicles (NO RED)                                                               Sports drinks like Gatorade (NO RED)                 The day of surgery:  Drink ONE (1) Pre-Surgery G2 at 1:15 PM the morning of surgery. Drink in one sitting. Do not sip.  This drink was given to you during your hospital  pre-op  appointment visit. Nothing else to drink after completing the  Pre-Surgery G2.          If you have questions, please contact your surgeon's office.   FOLLOW BOWEL PREP AND ANY ADDITIONAL PRE OP INSTRUCTIONS YOU RECEIVED FROM YOUR SURGEON'S OFFICE!!!     Oral Hygiene is also important to reduce your risk of infection.                                    Remember - BRUSH YOUR  TEETH THE MORNING OF SURGERY WITH YOUR REGULAR TOOTHPASTE  DENTURES WILL BE REMOVED PRIOR TO SURGERY PLEASE DO NOT APPLY "Poly grip" OR ADHESIVES!!!   Stop all vitamins and herbal supplements 7 days before surgery.   Take these medicines the morning of surgery with A SIP OF WATER: Diltiazem, Hydrocodone, Nebivolol, Omeprazole, Tramadol  These are anesthesia recommendations for holding your anticoagulants.  Please contact your prescribing physician to confirm IF it is safe to hold your anticoagulants for this length of time.   Eliquis Apixaban   72 hours   Xarelto Rivaroxaban   72 hours  Plavix Clopidogrel   120 hours  Pletal Cilostazol   120 hours    DO NOT TAKE ANY ORAL DIABETIC MEDICATIONS DAY OF YOUR SURGERY  How to Manage Your Diabetes Before and After Surgery  Why is it important to control my blood sugar before and after surgery? Improving blood sugar levels before and after surgery helps healing and can limit problems. A way of improving blood sugar control is eating a healthy diet by:  Eating less sugar and carbohydrates  Increasing activity/exercise  Talking with your doctor about reaching your blood sugar goals High blood sugars (greater than 180 mg/dL) can raise your risk of infections and slow your recovery, so you will need to focus on controlling your diabetes during the weeks before surgery. Make sure that the doctor who takes care of your diabetes knows about your planned surgery including the date and location.  How do I manage my blood sugar before surgery? Check your blood sugar at  least 4 times a day, starting 2 days before surgery, to make sure that the level is not too high or low. Check your blood sugar the morning of your surgery when you wake up and every 2 hours until you get to the Short Stay unit. If your blood sugar is less than 70 mg/dL, you will need to treat for low blood sugar: Do not take insulin. Treat a low blood sugar (less than 70 mg/dL) with  cup of clear juice (cranberry or apple), 4 glucose tablets, OR glucose gel. Recheck blood sugar in 15 minutes after treatment (to make sure it is greater than 70 mg/dL). If your blood sugar is not greater than 70 mg/dL on recheck, call [phone] for further instructions. Report your blood sugar to the short stay nurse when you get to Short Stay.  If you are admitted to the hospital after surgery: Your blood sugar will be checked by the staff and you will probably be given insulin after surgery (instead of oral diabetes medicines) to make sure you have good blood sugar levels. The goal for blood sugar control after surgery is 80-180 mg/dL.   WHAT DO I DO ABOUT MY DIABETES MEDICATION?  Do not take oral diabetes medicines (pills) the morning of surgery.  Hold Ozempic 7 days prior to surgery. Do not take 09/08/23.  Hold Jardiance for 3 days prior to surgery. Last dose 08/07/23  THE DAY BEFORE SURGERY, take only morning dose of Glimepiride, no evening dose. Take Metformin as prescribed. Take 50% of Lantus  THE MORNING OF SURGERY, do not take Glimepiride, Metformin, or Lantus  DO NOT TAKE THE FOLLOWING 7 DAYS PRIOR TO SURGERY: Ozempic, Wegovy, Rybelsus (Semaglutide), Byetta (exenatide), Bydureon (exenatide ER), Victoza, Saxenda (liraglutide), or Trulicity (dulaglutide) Mounjaro (Tirzepatide) Adlyxin (Lixisenatide), Polyethylene Glycol Loxenatide.  Reviewed and Endorsed by [facility] Patient Education Committee, August 2015  You may not have any metal on your body including hair pins,  jewelry, and body piercing             Do not wear make-up, lotions, powders, perfumes, or deodorant  Do not wear nail polish including gel and S&S, artificial/acrylic nails, or any other type of covering on natural nails including finger and toenails. If you have artificial nails, gel coating, etc. that needs to be removed by a nail salon please have this removed prior to surgery or surgery may need to be canceled/ delayed if the surgeon/ anesthesia feels like they are unable to be safely monitored.   Do not shave  48 hours prior to surgery.    Do not bring valuables to the hospital. [place] IS NOT             RESPONSIBLE   FOR VALUABLES.   Contacts, glasses, dentures or bridgework may not be worn into surgery.   Bring small overnight bag day of surgery.   DO NOT BRING YOUR HOME MEDICATIONS TO THE HOSPITAL. PHARMACY WILL DISPENSE MEDICATIONS LISTED ON YOUR MEDICATION LIST TO YOU DURING YOUR ADMISSION IN THE HOSPITAL!              Please read over the following fact sheets you were given: IF YOU HAVE QUESTIONS ABOUT YOUR PRE-OP INSTRUCTIONS PLEASE CALL [phone] [place]    If you received a COVID test during your pre-op visit  it is requested that you wear a mask when out in public, stay away from anyone that may not be feeling well and notify your surgeon if you develop symptoms. If you test positive for Covid or have been in contact with anyone that has tested positive in the last 10 days please notify you surgeon.      Pre-operative 5 CHG Bath Instructions   You can play a key role in reducing the risk of infection after surgery. Your skin needs to be as free of germs as possible. You can reduce the number of germs on your skin by washing with CHG (chlorhexidine gluconate) soap before surgery. CHG is an antiseptic soap that kills germs and continues to kill germs even after washing.   DO NOT use if you have an allergy to chlorhexidine/CHG or antibacterial soaps. If your skin  becomes reddened or irritated, stop using the CHG and notify one of our RNs at [phone].   Please shower with the CHG soap starting 4 days before surgery using the following schedule:     Please keep in mind the following:  DO NOT shave, including legs and underarms, starting the day of your first shower.   You may shave your face at any point before/day of surgery.  Place clean sheets on your bed the day you start using CHG soap. Use a clean washcloth (not used since being washed) for each shower. DO NOT sleep with pets once you start using the CHG.   CHG Shower Instructions:  If you choose to wash your hair and private area, wash first with your normal shampoo/soap.  After you use shampoo/soap, rinse your hair and body thoroughly to remove shampoo/soap residue.  Turn the water OFF and apply about 3 tablespoons (45 ml) of CHG soap to a CLEAN washcloth.  Apply CHG soap ONLY FROM YOUR NECK DOWN TO YOUR TOES (washing for 3-5 minutes)  DO NOT use CHG soap on face, private areas, open wounds, or sores.  Pay special attention to the area where your surgery  is being performed.  If you are having back surgery, having someone wash your back for you may be helpful. Wait 2 minutes after CHG soap is applied, then you may rinse off the CHG soap.  Pat dry with a clean towel  Put on clean clothes/pajamas   If you choose to wear lotion, please use ONLY the CHG-compatible lotions on the back of this paper.     Additional instructions for the day of surgery: DO NOT APPLY any lotions, deodorants, cologne, or perfumes.   Put on clean/comfortable clothes.  Brush your teeth.  Ask your nurse before applying any prescription medications to the skin.      CHG Compatible Lotions   Aveeno Moisturizing lotion  Cetaphil Moisturizing Cream  Cetaphil Moisturizing Lotion  Clairol Herbal Essence Moisturizing Lotion, Dry Skin  Clairol Herbal Essence Moisturizing Lotion, Extra Dry Skin  Clairol Herbal  Essence Moisturizing Lotion, Normal Skin  Curel Age Defying Therapeutic Moisturizing Lotion with Alpha Hydroxy  Curel Extreme Care Body Lotion  Curel Soothing Hands Moisturizing Hand Lotion  Curel Therapeutic Moisturizing Cream, Fragrance-Free  Curel Therapeutic Moisturizing Lotion, Fragrance-Free  Curel Therapeutic Moisturizing Lotion, Original Formula  Eucerin Daily Replenishing Lotion  Eucerin Dry Skin Therapy Plus Alpha Hydroxy Crme  Eucerin Dry Skin Therapy Plus Alpha Hydroxy Lotion  Eucerin Original Crme  Eucerin Original Lotion  Eucerin Plus Crme Eucerin Plus Lotion  Eucerin TriLipid Replenishing Lotion  Keri Anti-Bacterial Hand Lotion  Keri Deep Conditioning Original Lotion Dry Skin Formula Softly Scented  Keri Deep Conditioning Original Lotion, Fragrance Free Sensitive Skin Formula  Keri Lotion Fast Absorbing Fragrance Free Sensitive Skin Formula  Keri Lotion Fast Absorbing Softly Scented Dry Skin Formula  Keri Original Lotion  Keri Skin Renewal Lotion Keri Silky Smooth Lotion  Keri Silky Smooth Sensitive Skin Lotion  Nivea Body Creamy Conditioning Oil  Nivea Body Extra Enriched Lotion  Nivea Body Original Lotion  Nivea Body Sheer Moisturizing Lotion Nivea Crme  Nivea Skin Firming Lotion  NutraDerm 30 Skin Lotion  NutraDerm Skin Lotion  NutraDerm Therapeutic Skin Cream  NutraDerm Therapeutic Skin Lotion  ProShield Protective Hand Cream  Provon moisturizing lotion   Incentive Spirometer  An incentive spirometer is a tool that can help keep your lungs clear and active. This tool measures how well you are filling your lungs with each breath. Taking long deep breaths may help reverse or decrease the chance of developing breathing (pulmonary) problems (especially infection) following: A long period of time when you are unable to move or be active. BEFORE THE PROCEDURE  If the spirometer includes an indicator to show your best effort, your nurse or respiratory therapist  will set it to a desired goal. If possible, sit up straight or lean slightly forward. Try not to slouch. Hold the incentive spirometer in an upright position. INSTRUCTIONS FOR USE  Sit on the edge of your bed if possible, or sit up as far as you can in bed or on a chair. Hold the incentive spirometer in an upright position. Breathe out normally. Place the mouthpiece in your mouth and seal your lips tightly around it. Breathe in slowly and as deeply as possible, raising the piston or the ball toward the top of the column. Hold your breath for 3-5 seconds or for as long as possible. Allow the piston or ball to fall to the bottom of the column. Remove the mouthpiece from your mouth and breathe out normally. Rest for a few seconds and repeat Steps 1 through 7 at  least 10 times every 1-2 hours when you are awake. Take your time and take a few normal breaths between deep breaths. The spirometer may include an indicator to show your best effort. Use the indicator as a goal to work toward during each repetition. After each set of 10 deep breaths, practice coughing to be sure your lungs are clear. If you have an incision (the cut made at the time of surgery), support your incision when coughing by placing a pillow or rolled up towels firmly against it. Once you are able to get out of bed, walk around indoors and cough well. You may stop using the incentive spirometer when instructed by your caregiver.  RISKS AND COMPLICATIONS Take your time so you do not get dizzy or light-headed. If you are in pain, you may need to take or ask for pain medication before doing incentive spirometry. It is harder to take a deep breath if you are having pain. AFTER USE Rest and breathe slowly and easily. It can be helpful to keep track of a log of your progress. Your caregiver can provide you with a simple table to help with this. If you are using the spirometer at home, follow these instructions: SEEK MEDICAL CARE IF:   You are having difficultly using the spirometer. You have trouble using the spirometer as often as instructed. Your pain medication is not giving enough relief while using the spirometer. You develop fever of 100.5 F (38.1 C) or higher. SEEK IMMEDIATE MEDICAL CARE IF:  You cough up bloody sputum that had not been present before. You develop fever of 102 F (38.9 C) or greater. You develop worsening pain at or near the incision site. MAKE SURE YOU:  Understand these instructions. Will watch your condition. Will get help right away if you are not doing well or get worse. Document Released: 09/30/2006 Document Revised: 08/12/2011 Document Reviewed: 12/01/2006 Exit[employer] Patient Information 2014 [place] [employer].   ________________________________________________________________________ [facility]- Preparing for Total Shoulder Arthroplasty    Before surgery, you can play an important role. Because skin is not sterile, your skin needs to be as free of germs as possible. You can reduce the number of germs on your skin by using the following products. Benzoyl Peroxide Gel Reduces the number of germs present on the skin Applied twice a day to shoulder area starting two days before surgery    ==================================================================  Please follow these instructions carefully:  BENZOYL PEROXIDE 5% GEL  Please do not use if you have an allergy to benzoyl peroxide.   If your skin becomes reddened/irritated stop using the benzoyl peroxide.  Starting two days before surgery, apply as follows: Apply benzoyl peroxide in the morning and at night. Apply after taking a shower. If you are not taking a shower clean entire shoulder front, back, and side along with the armpit with a clean wet washcloth.  Place a quarter-sized dollop on your shoulder and rub in thoroughly, making sure to cover the front, back, and side of your shoulder, along with the armpit.   2 days  before ____ AM   ____ PM              1 day before ____ AM   ____ PM                         Do this twice a day for two days.  (Last application is the night before surgery, AFTER using the CHG soap  as described below).  Do NOT apply benzoyl peroxide gel on the day of surgery.

## 2023-09-08 ENCOUNTER — Other Ambulatory Visit: Payer: Self-pay

## 2023-09-08 ENCOUNTER — Encounter (HOSPITAL_COMMUNITY)
Admission: RE | Admit: 2023-09-08 | Discharge: 2023-09-08 | Disposition: A | Discharge status: Home / Self Care | Source: Ambulatory Visit | Attending: Orthopedic Surgery | Admitting: Orthopedic Surgery

## 2023-09-08 ENCOUNTER — Encounter (HOSPITAL_COMMUNITY): Payer: Self-pay

## 2023-09-08 VITALS — BP 157/78 | HR 67 | Temp 98.2°F | Resp 16 | Ht 64.0 in | Wt 213.0 lb

## 2023-09-08 DIAGNOSIS — I2510 Atherosclerotic heart disease of native coronary artery without angina pectoris: Secondary | ICD-10-CM | POA: Insufficient documentation

## 2023-09-08 DIAGNOSIS — Z87891 Personal history of nicotine dependence: Secondary | ICD-10-CM | POA: Diagnosis not present

## 2023-09-08 DIAGNOSIS — E119 Type 2 diabetes mellitus without complications: Secondary | ICD-10-CM | POA: Diagnosis not present

## 2023-09-08 DIAGNOSIS — G8929 Other chronic pain: Secondary | ICD-10-CM | POA: Diagnosis not present

## 2023-09-08 DIAGNOSIS — K449 Diaphragmatic hernia without obstruction or gangrene: Secondary | ICD-10-CM | POA: Insufficient documentation

## 2023-09-08 DIAGNOSIS — Z01812 Encounter for preprocedural laboratory examination: Secondary | ICD-10-CM | POA: Diagnosis present

## 2023-09-08 DIAGNOSIS — Z7901 Long term (current) use of anticoagulants: Secondary | ICD-10-CM | POA: Insufficient documentation

## 2023-09-08 DIAGNOSIS — Z6836 Body mass index (BMI) 36.0-36.9, adult: Secondary | ICD-10-CM | POA: Diagnosis not present

## 2023-09-08 DIAGNOSIS — E669 Obesity, unspecified: Secondary | ICD-10-CM | POA: Diagnosis not present

## 2023-09-08 DIAGNOSIS — J449 Chronic obstructive pulmonary disease, unspecified: Secondary | ICD-10-CM | POA: Diagnosis not present

## 2023-09-08 DIAGNOSIS — I480 Paroxysmal atrial fibrillation: Secondary | ICD-10-CM | POA: Diagnosis not present

## 2023-09-08 DIAGNOSIS — I10 Essential (primary) hypertension: Secondary | ICD-10-CM | POA: Diagnosis not present

## 2023-09-08 DIAGNOSIS — K219 Gastro-esophageal reflux disease without esophagitis: Secondary | ICD-10-CM | POA: Diagnosis not present

## 2023-09-08 DIAGNOSIS — E088 Diabetes mellitus due to underlying condition with unspecified complications: Secondary | ICD-10-CM

## 2023-09-08 DIAGNOSIS — Z01818 Encounter for other preprocedural examination: Secondary | ICD-10-CM

## 2023-09-08 LAB — CBC
HCT: 47.2 % — ABNORMAL HIGH (ref 36.0–46.0)
Hemoglobin: 14.5 g/dL (ref 12.0–15.0)
MCH: 29.2 pg (ref 26.0–34.0)
MCHC: 30.7 g/dL (ref 30.0–36.0)
MCV: 95.2 fL (ref 80.0–100.0)
Platelets: 417 10*3/uL — ABNORMAL HIGH (ref 150–400)
RBC: 4.96 MIL/uL (ref 3.87–5.11)
RDW: 14.3 % (ref 11.5–15.5)
WBC: 11.2 10*3/uL — ABNORMAL HIGH (ref 4.0–10.5)
nRBC: 0 % (ref 0.0–0.2)

## 2023-09-08 LAB — BASIC METABOLIC PANEL WITH GFR
Anion gap: 12 (ref 5–15)
BUN: 25 mg/dL — ABNORMAL HIGH (ref 8–23)
CO2: 21 mmol/L — ABNORMAL LOW (ref 22–32)
Calcium: 9.2 mg/dL (ref 8.9–10.3)
Chloride: 106 mmol/L (ref 98–111)
Creatinine, Ser: 0.91 mg/dL (ref 0.44–1.00)
GFR, Estimated: >60 mL/min (ref >60)
Glucose, Bld: 260 mg/dL — ABNORMAL HIGH (ref 70–99)
Potassium: 3.7 mmol/L (ref 3.5–5.1)
Sodium: 139 mmol/L (ref 135–145)

## 2023-09-08 LAB — SURGICAL PCR SCREEN
MRSA, PCR: NEGATIVE
Staphylococcus aureus: NEGATIVE

## 2023-09-08 LAB — GLUCOSE, CAPILLARY: Glucose-Capillary: 240 mg/dL — ABNORMAL HIGH (ref 70–99)

## 2023-09-09 LAB — HEMOGLOBIN A1C
Hgb A1c MFr Bld: 8.4 % — ABNORMAL HIGH (ref 4.8–5.6)
Mean Plasma Glucose: 194 mg/dL

## 2023-09-09 NOTE — Progress Notes (Signed)
 A1C 8.1, results routed to Dr. Ranell Patrick

## 2023-09-09 NOTE — Progress Notes (Addendum)
 Anesthesia Chart Review:  68 year old female follows with cardiology for history of PAF on Eliquis and flecainide, HTN, coronary calcifications.  Nuclear stress test 12/2021 was low risk.  Echo 09/2022 showed EF 60 to 65%, moderate LVH, grade 1 DD, normal RV function, no significant valvular abnormalities.  Event monitor 10/2022 showed underlying rhythm to be sinus, multiple SVT episodes (longest 27 seconds), 11.1% ventricular ectopy, less than 1% supraventricular ectopy.  Seen by Rise Paganini, NP on 09/05/2023 for preop evaluation.  Per note, "According to the Revised Cardiac Risk Index (RCRI), her Perioperative Risk of Major Cardiac Event is (%): 0.4. Her Functional Capacity in METs is: 5.04 according to the Duke Activity Status Index (DASI). Therefore, based on ACC/AHA guidelines, patient would be at acceptable risk for the planned procedure without further cardiovascular testing. The patient was advised that if she develops new symptoms prior to surgery to contact our office to arrange for a follow-up visit, and she verbalized understanding. Per office protocol, patient can hold Eliquis for 3 days prior to procedure. Patient will not need bridging with Lovenox (enoxaparin) around procedure."  Other pertinent history includes PONV, IDDM 2, former smoker with associated COPD (not on any daily inhaled medications), GERD (on PPI), hiatal hernia, chronic pain, obesity BMI 36.  Patient reports last dose of Eliquis 09/07/2023.  Patient reports last dose GLP-1 agonist 09/01/2023.  Preop labs reviewed, DM2 poorly controlled with A1c 8.4, otherwise unremarkable.  EKG 05/23/2023: Normal sinus rhythm.  Rate 78. Left axis deviation  14-day event monitor 10/2022: Predominant rhythm was sinus rhythm Multiple SVT episodes, longest 27 seconds 11.1% ventricular ectopy Less than 1% supraventricular ectopy Patient triggered episodes associated with sinus rhythm as well as sinus rhythm with possible atrial  tachycardia  TTE 09/27/2022: 1. Left ventricular ejection fraction, by estimation, is 60 to 65%. The  left ventricle has normal function. The left ventricle has no regional  wall motion abnormalities. There is moderate left ventricular hypertrophy.  Left ventricular diastolic  parameters are consistent with Grade I diastolic dysfunction (impaired  relaxation). The average left ventricular global longitudinal strain is  -12.2 %. The global longitudinal strain is abnormal.   2. Right ventricular systolic function is normal. The right ventricular  size is normal.   3. The mitral valve is normal in structure. No evidence of mitral valve  regurgitation. No evidence of mitral stenosis.   4. The aortic valve is normal in structure. Aortic valve regurgitation is  not visualized. No aortic stenosis is present.   5. The inferior vena cava is normal in size with greater than 50%  respiratory variability, suggesting right atrial pressure of 3 mmHg.   Nuclear stress 12/19/2021:   The study is normal. The study is low risk.   Left ventricular function is normal. Nuclear stress EF: 82 %. The left ventricular ejection fraction is hyperdynamic (>65%). End diastolic cavity size is normal.   Prior study not available for comparison.   Zannie Cove [facility] Short Stay Center/Anesthesiology Phone [phone] 09/09/2023 8:43 AM

## 2023-09-09 NOTE — Anesthesia Preprocedure Evaluation (Signed)
 Anesthesia Evaluation  Patient identified by MRN, date of birth, ID band Patient awake    Reviewed: Allergy & Precautions, NPO status , Patient's Chart, lab work & pertinent test results  History of Anesthesia Complications (+) PONV and history of anesthetic complications  Airway Mallampati: II  TM Distance: >3 FB Neck ROM: Full    Dental  (+) Teeth Intact, Dental Advisory Given   Pulmonary COPD, former smoker   breath sounds clear to auscultation       Cardiovascular hypertension, + Peripheral Vascular Disease  + dysrhythmias Atrial Fibrillation  Rhythm:Regular Rate:Normal     Neuro/Psych  PSYCHIATRIC DISORDERS Anxiety Depression     Neuromuscular disease    GI/Hepatic Neg liver ROS, hiatal hernia,GERD  ,,  Endo/Other  diabetes    Renal/GU negative Renal ROS     Musculoskeletal  (+) Arthritis ,    Abdominal   Peds  Hematology  (+) Blood dyscrasia, anemia   Anesthesia Other Findings   Reproductive/Obstetrics                             Anesthesia Physical Anesthesia Plan  ASA: 3  Anesthesia Plan: General   Post-op Pain Management: Regional block*   Induction: Intravenous  PONV Risk Score and Plan: 4 or greater and Ondansetron, Dexamethasone, Midazolam and Treatment may vary due to age or medical condition  Airway Management Planned: Oral ETT  Additional Equipment: None  Intra-op Plan:   Post-operative Plan: Extubation in OR  Informed Consent: I have reviewed the patients History and Physical, chart, labs and discussed the procedure including the risks, benefits and alternatives for the proposed anesthesia with the patient or authorized representative who has indicated his/her understanding and acceptance.     Dental advisory given  Plan Discussed with: CRNA  Anesthesia Plan Comments: (PAT note by Antionette Poles, PA-C: 68 year old female follows with cardiology for history  of PAF on Eliquis and flecainide, HTN, coronary calcifications.  Nuclear stress test 12/2021 was low risk.  Echo 09/2022 showed EF 60 to 65%, moderate LVH, grade 1 DD, normal RV function, no significant valvular abnormalities.  Event monitor 10/2022 showed underlying rhythm to be sinus, multiple SVT episodes (longest 27 seconds), 11.1% ventricular ectopy, less than 1% supraventricular ectopy.  Seen by Rise Paganini, NP on 09/05/2023 for preop evaluation.  Per note, "According to the Revised Cardiac Risk Index (RCRI), her Perioperative Risk of Major Cardiac Event is (%): 0.4. Her Functional Capacity in METs is: 5.04 according to the Duke Activity Status Index (DASI). Therefore, based on ACC/AHA guidelines, patient would be at acceptable risk for the planned procedure without further cardiovascular testing. The patient was advised that if she develops new symptoms prior to surgery to contact our office to arrange for a follow-up visit, and she verbalized understanding. Per office protocol, patient can hold Eliquis for 3 days prior to procedure. Patient will not need bridging with Lovenox (enoxaparin) around procedure."  Other pertinent history includes PONV, IDDM 2, former smoker with associated COPD (not on any daily inhaled medications), GERD (on PPI), hiatal hernia, chronic pain, obesity BMI 36.  Patient reports last dose of Eliquis 09/07/2023.  Patient reports last dose GLP-1 agonist 09/01/2023.  Preop labs reviewed, DM2 poorly controlled with A1c 8.4, otherwise unremarkable.  EKG 05/23/2023: Normal sinus rhythm.  Rate 78. Left axis deviation  14-day event monitor 10/2022: Predominant rhythm was sinus rhythm Multiple SVT episodes, longest 27 seconds 11.1% ventricular ectopy Less than 1%  supraventricular ectopy Patient triggered episodes associated with sinus rhythm as well as sinus rhythm with possible atrial tachycardia  TTE 09/27/2022: 1. Left ventricular ejection fraction, by estimation, is 60 to  65%. The  left ventricle has normal function. The left ventricle has no regional  wall motion abnormalities. There is moderate left ventricular hypertrophy.  Left ventricular diastolic  parameters are consistent with Grade I diastolic dysfunction (impaired  relaxation). The average left ventricular global longitudinal strain is  -12.2 %. The global longitudinal strain is abnormal.  2. Right ventricular systolic function is normal. The right ventricular  size is normal.  3. The mitral valve is normal in structure. No evidence of mitral valve  regurgitation. No evidence of mitral stenosis.  4. The aortic valve is normal in structure. Aortic valve regurgitation is  not visualized. No aortic stenosis is present.  5. The inferior vena cava is normal in size with greater than 50%  respiratory variability, suggesting right atrial pressure of 3 mmHg.   Nuclear stress 12/19/2021:   The study is normal. The study is low risk.   Left ventricular function is normal. Nuclear stress EF: 82 %. The left ventricular ejection fraction is hyperdynamic (>65%). End diastolic cavity size is normal.   Prior study not available for comparison.  )        Anesthesia Quick Evaluation

## 2023-09-10 ENCOUNTER — Observation Stay (HOSPITAL_COMMUNITY)
Admission: RE | Admit: 2023-09-10 | Discharge: 2023-09-11 | Disposition: A | Discharge status: Home / Self Care | Attending: Orthopedic Surgery | Admitting: Orthopedic Surgery

## 2023-09-10 ENCOUNTER — Other Ambulatory Visit: Payer: Self-pay

## 2023-09-10 ENCOUNTER — Encounter (HOSPITAL_COMMUNITY)
Admission: RE | Disposition: A | Discharge status: Home / Self Care | Payer: Self-pay | Source: Home / Self Care | Attending: Orthopedic Surgery

## 2023-09-10 ENCOUNTER — Ambulatory Visit (HOSPITAL_COMMUNITY)

## 2023-09-10 ENCOUNTER — Ambulatory Visit (HOSPITAL_COMMUNITY): Payer: Self-pay | Admitting: Physician Assistant

## 2023-09-10 ENCOUNTER — Encounter (HOSPITAL_COMMUNITY): Payer: Self-pay | Admitting: Orthopedic Surgery

## 2023-09-10 DIAGNOSIS — I480 Paroxysmal atrial fibrillation: Secondary | ICD-10-CM | POA: Diagnosis not present

## 2023-09-10 DIAGNOSIS — Z79899 Other long term (current) drug therapy: Secondary | ICD-10-CM | POA: Insufficient documentation

## 2023-09-10 DIAGNOSIS — Z7901 Long term (current) use of anticoagulants: Secondary | ICD-10-CM | POA: Insufficient documentation

## 2023-09-10 DIAGNOSIS — Z8616 Personal history of COVID-19: Secondary | ICD-10-CM | POA: Insufficient documentation

## 2023-09-10 DIAGNOSIS — Z96612 Presence of left artificial shoulder joint: Principal | ICD-10-CM

## 2023-09-10 DIAGNOSIS — I10 Essential (primary) hypertension: Secondary | ICD-10-CM | POA: Diagnosis not present

## 2023-09-10 DIAGNOSIS — S42292A Other displaced fracture of upper end of left humerus, initial encounter for closed fracture: Secondary | ICD-10-CM

## 2023-09-10 DIAGNOSIS — Z794 Long term (current) use of insulin: Secondary | ICD-10-CM | POA: Insufficient documentation

## 2023-09-10 DIAGNOSIS — W19XXXA Unspecified fall, initial encounter: Secondary | ICD-10-CM | POA: Diagnosis not present

## 2023-09-10 DIAGNOSIS — Z7984 Long term (current) use of oral hypoglycemic drugs: Secondary | ICD-10-CM | POA: Insufficient documentation

## 2023-09-10 DIAGNOSIS — Z96651 Presence of right artificial knee joint: Secondary | ICD-10-CM | POA: Insufficient documentation

## 2023-09-10 DIAGNOSIS — Z87891 Personal history of nicotine dependence: Secondary | ICD-10-CM | POA: Diagnosis not present

## 2023-09-10 DIAGNOSIS — Z86718 Personal history of other venous thrombosis and embolism: Secondary | ICD-10-CM | POA: Diagnosis not present

## 2023-09-10 DIAGNOSIS — I4891 Unspecified atrial fibrillation: Secondary | ICD-10-CM

## 2023-09-10 DIAGNOSIS — E088 Diabetes mellitus due to underlying condition with unspecified complications: Secondary | ICD-10-CM

## 2023-09-10 DIAGNOSIS — E119 Type 2 diabetes mellitus without complications: Secondary | ICD-10-CM | POA: Diagnosis not present

## 2023-09-10 DIAGNOSIS — J449 Chronic obstructive pulmonary disease, unspecified: Secondary | ICD-10-CM | POA: Insufficient documentation

## 2023-09-10 HISTORY — PX: REVERSE SHOULDER ARTHROPLASTY: SHX5054

## 2023-09-10 LAB — GLUCOSE, CAPILLARY
Glucose-Capillary: 184 mg/dL — ABNORMAL HIGH (ref 70–99)
Glucose-Capillary: 123 mg/dL — ABNORMAL HIGH (ref 70–99)
Glucose-Capillary: 363 mg/dL — ABNORMAL HIGH (ref 70–99)

## 2023-09-10 SURGERY — ARTHROPLASTY, SHOULDER, TOTAL, REVERSE
Anesthesia: General | Site: Shoulder | Laterality: Left

## 2023-09-10 MED ORDER — BUPIVACAINE-EPINEPHRINE (PF) 0.25% -1:200000 IJ SOLN
INTRAMUSCULAR | Status: DC | PRN
  Administered 2023-09-10: 15 mL

## 2023-09-10 MED ORDER — OXYCODONE HCL 5 MG PO TABS
ORAL_TABLET | ORAL | Status: AC
  Filled 2023-09-10: qty 1

## 2023-09-10 MED ORDER — PHENOL 1.4 % MT LIQD: 1.0000 | OROMUCOSAL | Status: DC | PRN

## 2023-09-10 MED ORDER — FENTANYL CITRATE (PF) 100 MCG/2ML IJ SOLN
INTRAMUSCULAR | Status: AC
Start: 2023-09-10 — End: ?
  Filled 2023-09-10: qty 2

## 2023-09-10 MED ORDER — PROPOFOL 10 MG/ML IV BOLUS
INTRAVENOUS | Status: AC
  Filled 2023-09-10: qty 20

## 2023-09-10 MED ORDER — GLIMEPIRIDE 4 MG PO TABS
4.0000 mg | ORAL_TABLET | Freq: Every day | ORAL | Status: DC
Start: 2023-09-11 — End: 2023-09-11
  Administered 2023-09-11: 4 mg via ORAL
  Filled 2023-09-10: qty 1

## 2023-09-10 MED ORDER — SEMAGLUTIDE(0.25 OR 0.5MG/DOS) 2 MG/3ML ~~LOC~~ SOPN: 0.2500 mg | PEN_INJECTOR | SUBCUTANEOUS | Status: DC

## 2023-09-10 MED ORDER — BUPIVACAINE-EPINEPHRINE (PF) 0.25% -1:200000 IJ SOLN
INTRAMUSCULAR | Status: AC
  Filled 2023-09-10: qty 30

## 2023-09-10 MED ORDER — TRANEXAMIC ACID-NACL 1000-0.7 MG/100ML-% IV SOLN
1000.0000 mg | INTRAVENOUS | Status: AC
  Administered 2023-09-10: 1000 mg via INTRAVENOUS
  Filled 2023-09-10: qty 100

## 2023-09-10 MED ORDER — INSULIN ASPART 100 UNIT/ML IJ SOLN
0.0000 [IU] | Freq: Every day | INTRAMUSCULAR | Status: DC
  Administered 2023-09-10: 5 [IU] via SUBCUTANEOUS

## 2023-09-10 MED ORDER — INSULIN ASPART 100 UNIT/ML IJ SOLN
INTRAMUSCULAR | Status: AC
Start: 2023-09-10 — End: 2023-09-11
  Filled 2023-09-10: qty 1

## 2023-09-10 MED ORDER — METHOCARBAMOL 500 MG PO TABS: 500.0000 mg | ORAL_TABLET | Freq: Three times a day (TID) | ORAL | Status: DC | PRN

## 2023-09-10 MED ORDER — STERILE WATER FOR IRRIGATION IR SOLN
Status: DC | PRN
Start: 2023-09-10 — End: 2023-09-10
  Administered 2023-09-10: 1000 mL

## 2023-09-10 MED ORDER — DROPERIDOL 2.5 MG/ML IJ SOLN: 0.6250 mg | Freq: Once | INTRAMUSCULAR | Status: DC | PRN

## 2023-09-10 MED ORDER — MIDAZOLAM HCL 2 MG/2ML IJ SOLN
INTRAMUSCULAR | Status: AC
  Administered 2023-09-10: 2 mg via INTRAVENOUS
  Filled 2023-09-10: qty 2

## 2023-09-10 MED ORDER — EPHEDRINE SULFATE (PRESSORS) 50 MG/ML IJ SOLN
INTRAMUSCULAR | Status: DC | PRN
  Administered 2023-09-10: 10 mg via INTRAVENOUS

## 2023-09-10 MED ORDER — SODIUM CHLORIDE 0.9% FLUSH: 3.0000 mL | INTRAVENOUS | Status: DC | PRN

## 2023-09-10 MED ORDER — INSULIN ASPART 100 UNIT/ML IJ SOLN
0.0000 [IU] | Freq: Three times a day (TID) | INTRAMUSCULAR | Status: DC
  Administered 2023-09-11: 3 [IU] via SUBCUTANEOUS

## 2023-09-10 MED ORDER — HYDROCODONE-ACETAMINOPHEN 5-325 MG PO TABS
1.0000 | ORAL_TABLET | ORAL | Status: DC | PRN
  Administered 2023-09-11: 2 via ORAL
  Filled 2023-09-10: qty 2
  Filled 2023-09-10: qty 1

## 2023-09-10 MED ORDER — LIDOCAINE HCL (CARDIAC) PF 100 MG/5ML IV SOSY
PREFILLED_SYRINGE | INTRAVENOUS | Status: DC | PRN
  Administered 2023-09-10: 50 mg via INTRAVENOUS

## 2023-09-10 MED ORDER — SPIRONOLACTONE 25 MG PO TABS
25.0000 mg | ORAL_TABLET | Freq: Every day | ORAL | Status: DC
  Administered 2023-09-11: 25 mg via ORAL
  Filled 2023-09-10: qty 1

## 2023-09-10 MED ORDER — ACETAMINOPHEN 10 MG/ML IV SOLN
1000.0000 mg | Freq: Once | INTRAVENOUS | Status: DC | PRN
  Administered 2023-09-10: 1000 mg via INTRAVENOUS

## 2023-09-10 MED ORDER — PROPOFOL 10 MG/ML IV BOLUS
INTRAVENOUS | Status: DC | PRN
  Administered 2023-09-10: 130 mg via INTRAVENOUS

## 2023-09-10 MED ORDER — PROPOFOL 500 MG/50ML IV EMUL
INTRAVENOUS | Status: DC | PRN
  Administered 2023-09-10: 50 ug/kg/min via INTRAVENOUS

## 2023-09-10 MED ORDER — METOCLOPRAMIDE HCL 5 MG/ML IJ SOLN: 5.0000 mg | Freq: Three times a day (TID) | INTRAMUSCULAR | Status: DC | PRN

## 2023-09-10 MED ORDER — PANTOPRAZOLE SODIUM 40 MG PO TBEC
40.0000 mg | DELAYED_RELEASE_TABLET | Freq: Every day | ORAL | Status: DC
  Administered 2023-09-11: 40 mg via ORAL
  Filled 2023-09-10: qty 1

## 2023-09-10 MED ORDER — APIXABAN 5 MG PO TABS
5.0000 mg | ORAL_TABLET | Freq: Two times a day (BID) | ORAL | Status: DC
  Administered 2023-09-11: 5 mg via ORAL
  Filled 2023-09-10: qty 1

## 2023-09-10 MED ORDER — CARBOXYMETHYLCELLULOSE SODIUM 1 % OP SOLN: 1.0000 [drp] | Freq: Every day | OPHTHALMIC | Status: DC

## 2023-09-10 MED ORDER — METHOCARBAMOL 1000 MG/10ML IJ SOLN: 500.0000 mg | Freq: Four times a day (QID) | INTRAMUSCULAR | Status: DC | PRN

## 2023-09-10 MED ORDER — ACETAMINOPHEN 10 MG/ML IV SOLN
INTRAVENOUS | Status: AC
  Filled 2023-09-10: qty 100

## 2023-09-10 MED ORDER — LACTATED RINGERS IV SOLN: INTRAVENOUS | Status: DC | PRN

## 2023-09-10 MED ORDER — NEBIVOLOL HCL 10 MG PO TABS
20.0000 mg | ORAL_TABLET | Freq: Every day | ORAL | Status: DC
  Administered 2023-09-11: 20 mg via ORAL
  Filled 2023-09-10: qty 2

## 2023-09-10 MED ORDER — TRANEXAMIC ACID-NACL 1000-0.7 MG/100ML-% IV SOLN
1000.0000 mg | Freq: Once | INTRAVENOUS | Status: AC
  Administered 2023-09-10: 1000 mg via INTRAVENOUS
  Filled 2023-09-10: qty 100

## 2023-09-10 MED ORDER — INSULIN GLARGINE-YFGN 100 UNIT/ML ~~LOC~~ SOLN
40.0000 [IU] | Freq: Every day | SUBCUTANEOUS | Status: DC
  Administered 2023-09-10: 40 [IU] via SUBCUTANEOUS
  Filled 2023-09-10 (×2): qty 0.4

## 2023-09-10 MED ORDER — VANCOMYCIN HCL IN DEXTROSE 1-5 GM/200ML-% IV SOLN
1000.0000 mg | INTRAVENOUS | Status: AC
  Administered 2023-09-10: 1000 mg via INTRAVENOUS
  Filled 2023-09-10: qty 200

## 2023-09-10 MED ORDER — ACETAMINOPHEN 325 MG PO TABS: 325.0000 mg | ORAL_TABLET | Freq: Four times a day (QID) | ORAL | Status: DC | PRN

## 2023-09-10 MED ORDER — ONDANSETRON HCL 4 MG PO TABS: 4.0000 mg | ORAL_TABLET | Freq: Four times a day (QID) | ORAL | Status: DC | PRN

## 2023-09-10 MED ORDER — ONDANSETRON HCL 4 MG/2ML IJ SOLN
INTRAMUSCULAR | Status: DC | PRN
  Administered 2023-09-10: 4 mg via INTRAVENOUS

## 2023-09-10 MED ORDER — OXYCODONE HCL 5 MG PO TABS
5.0000 mg | ORAL_TABLET | Freq: Once | ORAL | Status: AC | PRN
  Administered 2023-09-10: 5 mg via ORAL

## 2023-09-10 MED ORDER — FUROSEMIDE 40 MG PO TABS
40.0000 mg | ORAL_TABLET | Freq: Every day | ORAL | Status: DC
  Administered 2023-09-11: 40 mg via ORAL
  Filled 2023-09-10 (×2): qty 1

## 2023-09-10 MED ORDER — DILTIAZEM HCL ER COATED BEADS 180 MG PO CP24
360.0000 mg | ORAL_CAPSULE | Freq: Every day | ORAL | Status: DC
  Administered 2023-09-11: 360 mg via ORAL
  Filled 2023-09-10: qty 2

## 2023-09-10 MED ORDER — SODIUM CHLORIDE 0.9% FLUSH: 3.0000 mL | Freq: Two times a day (BID) | INTRAVENOUS | Status: DC

## 2023-09-10 MED ORDER — BUPIVACAINE LIPOSOME 1.3 % IJ SUSP
INTRAMUSCULAR | Status: DC | PRN
  Administered 2023-09-10: 10 mL via PERINEURAL

## 2023-09-10 MED ORDER — LACTATED RINGERS IV SOLN: INTRAVENOUS | Status: DC

## 2023-09-10 MED ORDER — DEXAMETHASONE SODIUM PHOSPHATE 4 MG/ML IJ SOLN
INTRAMUSCULAR | Status: DC | PRN
  Administered 2023-09-10: 5 mg via INTRAVENOUS

## 2023-09-10 MED ORDER — INSULIN ASPART 100 UNIT/ML IJ SOLN
3.0000 [IU] | Freq: Three times a day (TID) | INTRAMUSCULAR | Status: DC
  Administered 2023-09-11: 3 [IU] via SUBCUTANEOUS

## 2023-09-10 MED ORDER — METOCLOPRAMIDE HCL 5 MG PO TABS: 5.0000 mg | ORAL_TABLET | Freq: Three times a day (TID) | ORAL | Status: DC | PRN

## 2023-09-10 MED ORDER — ATORVASTATIN CALCIUM 20 MG PO TABS
20.0000 mg | ORAL_TABLET | Freq: Every day | ORAL | Status: DC
  Administered 2023-09-11: 20 mg via ORAL
  Filled 2023-09-10: qty 1

## 2023-09-10 MED ORDER — CELECOXIB 200 MG PO CAPS
200.0000 mg | ORAL_CAPSULE | Freq: Every day | ORAL | Status: DC
  Administered 2023-09-11: 200 mg via ORAL
  Filled 2023-09-10: qty 1

## 2023-09-10 MED ORDER — MENTHOL 3 MG MT LOZG: 1.0000 | LOZENGE | OROMUCOSAL | Status: DC | PRN

## 2023-09-10 MED ORDER — ONDANSETRON HCL 4 MG/2ML IJ SOLN: 4.0000 mg | Freq: Four times a day (QID) | INTRAMUSCULAR | Status: DC | PRN

## 2023-09-10 MED ORDER — DILTIAZEM HCL 30 MG PO TABS: 30.0000 mg | ORAL_TABLET | Freq: Every day | ORAL | Status: DC | PRN

## 2023-09-10 MED ORDER — ROCURONIUM BROMIDE 100 MG/10ML IV SOLN
INTRAVENOUS | Status: DC | PRN
  Administered 2023-09-10: 50 mg via INTRAVENOUS

## 2023-09-10 MED ORDER — MIDAZOLAM HCL 2 MG/2ML IJ SOLN: 1.0000 mg | Freq: Once | INTRAMUSCULAR | Status: AC

## 2023-09-10 MED ORDER — VANCOMYCIN HCL IN DEXTROSE 1-5 GM/200ML-% IV SOLN
1000.0000 mg | Freq: Two times a day (BID) | INTRAVENOUS | Status: AC
  Administered 2023-09-11: 1000 mg via INTRAVENOUS
  Filled 2023-09-10: qty 200

## 2023-09-10 MED ORDER — PHENYLEPHRINE HCL-NACL 20-0.9 MG/250ML-% IV SOLN
INTRAVENOUS | Status: DC | PRN
Start: 2023-09-10 — End: 2023-09-10
  Administered 2023-09-10: 25 ug/min via INTRAVENOUS

## 2023-09-10 MED ORDER — FENTANYL CITRATE PF 50 MCG/ML IJ SOSY: 50.0000 ug | PREFILLED_SYRINGE | Freq: Once | INTRAMUSCULAR | Status: AC

## 2023-09-10 MED ORDER — OXYCODONE HCL 5 MG/5ML PO SOLN: 5.0000 mg | Freq: Once | ORAL | Status: AC | PRN

## 2023-09-10 MED ORDER — POLYVINYL ALCOHOL 1.4 % OP SOLN
1.0000 [drp] | Freq: Every day | OPHTHALMIC | Status: DC
  Administered 2023-09-11: 1 [drp] via OPHTHALMIC
  Filled 2023-09-10: qty 15

## 2023-09-10 MED ORDER — FENTANYL CITRATE PF 50 MCG/ML IJ SOSY: 25.0000 ug | PREFILLED_SYRINGE | INTRAMUSCULAR | Status: DC | PRN

## 2023-09-10 MED ORDER — BISACODYL 10 MG RE SUPP: 10.0000 mg | Freq: Every day | RECTAL | Status: DC | PRN

## 2023-09-10 MED ORDER — DOCUSATE SODIUM 100 MG PO CAPS
100.0000 mg | ORAL_CAPSULE | Freq: Two times a day (BID) | ORAL | Status: DC
  Administered 2023-09-10 – 2023-09-11 (×2): 100 mg via ORAL
  Filled 2023-09-10 (×2): qty 1

## 2023-09-10 MED ORDER — POLYETHYLENE GLYCOL 3350 17 G PO PACK: 17.0000 g | PACK | Freq: Every day | ORAL | Status: DC | PRN

## 2023-09-10 MED ORDER — FENTANYL CITRATE PF 50 MCG/ML IJ SOSY
PREFILLED_SYRINGE | INTRAMUSCULAR | Status: AC
  Administered 2023-09-10: 50 ug via INTRAVENOUS
  Filled 2023-09-10: qty 2

## 2023-09-10 MED ORDER — EMPAGLIFLOZIN 25 MG PO TABS
25.0000 mg | ORAL_TABLET | Freq: Every day | ORAL | Status: DC
  Administered 2023-09-11: 25 mg via ORAL
  Filled 2023-09-10: qty 1

## 2023-09-10 MED ORDER — TRAMADOL HCL 50 MG PO TABS: 50.0000 mg | ORAL_TABLET | Freq: Two times a day (BID) | ORAL | Status: DC | PRN

## 2023-09-10 MED ORDER — PHENYLEPHRINE HCL-NACL 20-0.9 MG/250ML-% IV SOLN
INTRAVENOUS | Status: AC
Start: 2023-09-10 — End: 2023-09-10
  Filled 2023-09-10: qty 250

## 2023-09-10 MED ORDER — 0.9 % SODIUM CHLORIDE (POUR BTL) OPTIME
TOPICAL | Status: DC | PRN
  Administered 2023-09-10: 1000 mL

## 2023-09-10 MED ORDER — FLECAINIDE ACETATE 50 MG PO TABS
50.0000 mg | ORAL_TABLET | Freq: Two times a day (BID) | ORAL | Status: DC
  Administered 2023-09-10 – 2023-09-11 (×2): 50 mg via ORAL
  Filled 2023-09-10 (×2): qty 1

## 2023-09-10 MED ORDER — INSULIN ASPART 100 UNIT/ML IJ SOLN
0.0000 [IU] | INTRAMUSCULAR | Status: DC | PRN
  Administered 2023-09-10: 4 [IU] via SUBCUTANEOUS

## 2023-09-10 MED ORDER — BUPIVACAINE HCL (PF) 0.5 % IJ SOLN
INTRAMUSCULAR | Status: DC | PRN
  Administered 2023-09-10: 12 mL via PERINEURAL

## 2023-09-10 MED ORDER — METFORMIN HCL 850 MG PO TABS
425.0000 mg | ORAL_TABLET | Freq: Every day | ORAL | Status: DC
  Administered 2023-09-11: 425 mg via ORAL
  Filled 2023-09-10: qty 0.5

## 2023-09-10 MED ORDER — ORAL CARE MOUTH RINSE: 15.0000 mL | Freq: Once | OROMUCOSAL | Status: AC

## 2023-09-10 MED ORDER — SUGAMMADEX SODIUM 200 MG/2ML IV SOLN
INTRAVENOUS | Status: DC | PRN
  Administered 2023-09-10: 200 mg via INTRAVENOUS

## 2023-09-10 MED ORDER — CHLORHEXIDINE GLUCONATE 0.12 % MT SOLN
15.0000 mL | Freq: Once | OROMUCOSAL | Status: AC
  Administered 2023-09-10: 15 mL via OROMUCOSAL

## 2023-09-10 MED ORDER — MORPHINE SULFATE (PF) 2 MG/ML IV SOLN: 0.5000 mg | INTRAVENOUS | Status: DC | PRN

## 2023-09-10 SURGICAL SUPPLY — 65 items
BAG COUNTER SPONGE SURGICOUNT (BAG) IMPLANT
BAG ZIPLOCK 12X15 (MISCELLANEOUS) IMPLANT
BIT DRILL 1.6MX128 (BIT) IMPLANT
BIT DRILL 170X2.5X (BIT) IMPLANT
BIT DRL 170X2.5X (BIT) ×1 IMPLANT
BLADE SAG 18X100X1.27 (BLADE) ×1 IMPLANT
COVER BACK TABLE 60X90IN (DRAPES) ×1 IMPLANT
COVER SURGICAL LIGHT HANDLE (MISCELLANEOUS) ×1 IMPLANT
DRAPE INCISE IOBAN 66X45 STRL (DRAPES) ×1 IMPLANT
DRAPE SHEET LG 3/4 BI-LAMINATE (DRAPES) ×1 IMPLANT
DRAPE SURG ORHT 6 SPLT 77X108 (DRAPES) ×2 IMPLANT
DRAPE TOP 10253 STERILE (DRAPES) ×1 IMPLANT
DRAPE U-SHAPE 47X51 STRL (DRAPES) ×1 IMPLANT
DRSG ADAPTIC 3X8 NADH LF (GAUZE/BANDAGES/DRESSINGS) ×1 IMPLANT
DRSG AQUACEL AG 3.5X4 (GAUZE/BANDAGES/DRESSINGS) IMPLANT
DRSG AQUACEL AG ADV 3.5X10 (GAUZE/BANDAGES/DRESSINGS) ×1 IMPLANT
DURAPREP 26ML APPLICATOR (WOUND CARE) ×1 IMPLANT
ELECT BLADE TIP CTD 4 INCH (ELECTRODE) ×1 IMPLANT
ELECT NDL TIP 2.8 STRL (NEEDLE) ×1 IMPLANT
ELECT NEEDLE TIP 2.8 STRL (NEEDLE) ×1 IMPLANT
ELECT PENCIL ROCKER SW 15FT (MISCELLANEOUS) ×1 IMPLANT
ELECT REM PT RETURN 15FT ADLT (MISCELLANEOUS) ×1 IMPLANT
FACESHIELD WRAPAROUND (MASK) ×1 IMPLANT
FACESHIELD WRAPAROUND OR TEAM (MASK) ×1 IMPLANT
GAUZE PAD ABD 8X10 STRL (GAUZE/BANDAGES/DRESSINGS) ×1 IMPLANT
GAUZE SPONGE 4X4 12PLY STRL (GAUZE/BANDAGES/DRESSINGS) ×1 IMPLANT
GAUZE XEROFORM 5X9 LF (GAUZE/BANDAGES/DRESSINGS) IMPLANT
GLENOSPHERE DELTA XTEND LAT 38 (Miscellaneous) IMPLANT
GLOVE BIOGEL PI IND STRL 7.5 (GLOVE) ×1 IMPLANT
GLOVE BIOGEL PI IND STRL 8.5 (GLOVE) ×1 IMPLANT
GLOVE ORTHO TXT STRL SZ7.5 (GLOVE) ×1 IMPLANT
GLOVE SURG ORTHO 8.5 STRL (GLOVE) ×1 IMPLANT
GOWN STRL REUS W/ TWL XL LVL3 (GOWN DISPOSABLE) ×2 IMPLANT
KIT BASIN OR (CUSTOM PROCEDURE TRAY) ×1 IMPLANT
KIT TURNOVER KIT A (KITS) ×1 IMPLANT
MANIFOLD NEPTUNE II (INSTRUMENTS) ×1 IMPLANT
METAGLENE DELTA EXTEND (Trauma) IMPLANT
METAGLENE DXTEND (Trauma) ×1 IMPLANT
NDL MAYO CATGUT SZ4 TPR NDL (NEEDLE) IMPLANT
NEEDLE MAYO CATGUT SZ4 (NEEDLE) IMPLANT
NS IRRIG 1000ML POUR BTL (IV SOLUTION) ×1 IMPLANT
PACK SHOULDER (CUSTOM PROCEDURE TRAY) ×1 IMPLANT
PIN GUIDE 1.2 (PIN) IMPLANT
PIN GUIDE GLENOPHERE 1.5MX300M (PIN) IMPLANT
PIN METAGLENE 2.5 (PIN) IMPLANT
RESTRAINT HEAD UNIVERSAL NS (MISCELLANEOUS) ×1 IMPLANT
SCREW LOCK 42 (Screw) IMPLANT
SCREW LOCK DELTA XTEND 4.5X30 (Screw) IMPLANT
SLING ARM FOAM STRAP LRG (SOFTGOODS) IMPLANT
SPACER 38 PLUS 3 (Spacer) IMPLANT
SPIKE FLUID TRANSFER (MISCELLANEOUS) ×1 IMPLANT
STAPLER SKIN PROX WIDE 3.9 (STAPLE) IMPLANT
STEM LEFT POROCOAT REV FX EPI (Stem) IMPLANT
STEM STANDARD SZ 10 113MM (Stem) ×1 IMPLANT
STEM STD SZ 10 113MM (Stem) IMPLANT
STRIP CLOSURE SKIN 1/2X4 (GAUZE/BANDAGES/DRESSINGS) ×1 IMPLANT
SUT FIBERWIRE #2 38 T-5 BLUE (SUTURE) ×7 IMPLANT
SUT MNCRL AB 4-0 PS2 18 (SUTURE) ×1 IMPLANT
SUT VIC AB 0 CT1 36 (SUTURE) ×1 IMPLANT
SUT VIC AB 0 CT2 27 (SUTURE) ×1 IMPLANT
SUT VIC AB 2-0 CT1 TAPERPNT 27 (SUTURE) ×1 IMPLANT
SUTURE FIBERWR #2 38 T-5 BLUE (SUTURE) ×2 IMPLANT
TAPE CLOTH SURG 4X10 WHT LF (GAUZE/BANDAGES/DRESSINGS) IMPLANT
TOWEL GREEN STERILE FF (TOWEL DISPOSABLE) ×1 IMPLANT
TOWEL OR 17X26 10 PK STRL BLUE (TOWEL DISPOSABLE) ×1 IMPLANT

## 2023-09-10 NOTE — Anesthesia Procedure Notes (Signed)
 Anesthesia Regional Block: Interscalene brachial plexus block   Pre-Anesthetic Checklist: , timeout performed,  Correct Patient, Correct Site, Correct Laterality,  Correct Procedure, Correct Position, site marked,  Risks and benefits discussed,  Surgical consent,  Pre-op evaluation,  At surgeon's request and post-op pain management  Laterality: Left  Prep: chloraprep       Needles:  Injection technique: Single-shot  Needle Type: Echogenic Stimulator Needle     Needle Length: 9cm  Needle Gauge: 21     Additional Needles:   Procedures:,,,, ultrasound used (permanent image in chart),,    Narrative:  Start time: 09/10/2023 3:25 PM End time: 09/10/2023 3:30 PM Injection made incrementally with aspirations every 5 mL.  Performed by: Personally  Anesthesiologist: Shelton Silvas, MD  Additional Notes: Discussed risks and benefits of the nerve block in detail, including but not limited vascular injury, permanent nerve damage and infection.   Patient tolerated the procedure well. Local anesthetic introduced in an incremental fashion under minimal resistance after negative aspirations. No paresthesias were elicited. After completion of the procedure, no acute issues were identified and patient continued to be monitored by RN.

## 2023-09-10 NOTE — Plan of Care (Signed)
  Problem: Education: Goal: Knowledge of General Education information will improve Description: Including pain rating scale, medication(s)/side effects and non-pharmacologic comfort measures Outcome: Progressing   Problem: Health Behavior/Discharge Planning: Goal: Ability to manage health-related needs will improve Outcome: Progressing   Problem: Clinical Measurements: Goal: Ability to maintain clinical measurements within normal limits will improve Outcome: Progressing Goal: Will remain free from infection Outcome: Progressing Goal: Diagnostic test results will improve Outcome: Progressing Goal: Respiratory complications will improve Outcome: Progressing Goal: Cardiovascular complication will be avoided Outcome: Progressing   Problem: Activity: Goal: Risk for activity intolerance will decrease Outcome: Progressing   Problem: Nutrition: Goal: Adequate nutrition will be maintained Outcome: Completed/Met   Problem: Coping: Goal: Level of anxiety will decrease Outcome: Progressing   Problem: Elimination: Goal: Will not experience complications related to bowel motility Outcome: Progressing Goal: Will not experience complications related to urinary retention Outcome: Progressing   Problem: Pain Managment: Goal: General experience of comfort will improve and/or be controlled Outcome: Progressing   Problem: Safety: Goal: Ability to remain free from injury will improve Outcome: Progressing   Problem: Skin Integrity: Goal: Risk for impaired skin integrity will decrease Outcome: Progressing   Problem: Education: Goal: Knowledge of the prescribed therapeutic regimen will improve Outcome: Progressing Goal: Understanding of activity limitations/precautions following surgery will improve Outcome: Progressing Goal: Individualized Educational Video(s) Outcome: Completed/Met   Problem: Activity: Goal: Ability to tolerate increased activity will improve Outcome:  Progressing   Problem: Pain Management: Goal: Pain level will decrease with appropriate interventions Outcome: Progressing

## 2023-09-10 NOTE — Anesthesia Procedure Notes (Signed)
 Procedure Name: Intubation Date/Time: 09/10/2023 4:49 PM  Performed by: Deri Fuelling, CRNAPre-anesthesia Checklist: Patient identified, Emergency Drugs available, Suction available and Patient being monitored Patient Re-evaluated:Patient Re-evaluated prior to induction Oxygen Delivery Method: Circle system utilized Preoxygenation: Pre-oxygenation with 100% oxygen Induction Type: IV induction Ventilation: Mask ventilation without difficulty Laryngoscope Size: Mac and 3 Grade View: Grade I Tube type: Oral Tube size: 7.0 mm Number of attempts: 1 Airway Equipment and Method: Stylet and Oral airway Placement Confirmation: ETT inserted through vocal cords under direct vision, positive ETCO2 and breath sounds checked- equal and bilateral Secured at: 21 cm Tube secured with: Tape Dental Injury: Teeth and Oropharynx as per pre-operative assessment

## 2023-09-10 NOTE — Plan of Care (Signed)
Discuss and review plan of care with patient/family  

## 2023-09-10 NOTE — Transfer of Care (Signed)
 Immediate Anesthesia Transfer of Care Note  Patient: Hailey Stanton  Procedure(s) Performed: ARTHROPLASTY, SHOULDER, TOTAL, REVERSE (Left: Shoulder)  Patient Location: PACU  Anesthesia Type:General and Regional  Level of Consciousness: awake and alert   Airway & Oxygen Therapy: Patient Spontanous Breathing and Patient connected to nasal cannula oxygen  Post-op Assessment: Report given to RN and Post -op Vital signs reviewed and stable  Post vital signs: Reviewed and stable  Last Vitals:  Vitals Value Taken Time  BP 141/72 09/10/23 1825  Temp    Pulse 67 09/10/23 1828  Resp 26 09/10/23 1828  SpO2 98 % 09/10/23 1828  Vitals shown include unfiled device data.  Last Pain:  Vitals:   09/10/23 1530  TempSrc:   PainSc: 0-No pain         Complications: No notable events documented.

## 2023-09-10 NOTE — Brief Op Note (Signed)
 09/10/2023  6:05 PM  PATIENT:  Hailey Stanton  68 y.o. female  PRE-OPERATIVE DIAGNOSIS:  Left comminuted and displaced proximal humerus fracture  POST-OPERATIVE DIAGNOSIS: same  PROCEDURE:  Procedure(s): ARTHROPLASTY, SHOULDER, TOTAL, REVERSE (Left) DePuy Delta Xtend with tuberosity repair  SURGEON:  Surgeons and Role:    Beverely Low, MD - Primary  PHYSICIAN ASSISTANT:   ASSISTANTS: Thea Gist, PA-C   ANESTHESIA:   regional and general  EBL:  140 mL   BLOOD ADMINISTERED:none  DRAINS: none   LOCAL MEDICATIONS USED:  MARCAINE     SPECIMEN:  No Specimen  DISPOSITION OF SPECIMEN:  N/A  COUNTS:  YES  TOURNIQUET:  * No tourniquets in log *  DICTATION: .Other Dictation: Dictation Number [id number]  PLAN OF CARE: Admit for overnight observation  PATIENT DISPOSITION:  PACU - hemodynamically stable.   Delay start of Pharmacological VTE agent (>24hrs) due to surgical blood loss or risk of bleeding: not applicable

## 2023-09-10 NOTE — Interval H&P Note (Signed)
 History and Physical Interval Note:  09/10/2023 2:19 PM  Hailey Stanton  has presented today for surgery, with the diagnosis of Left proximal humerus fracture.  The various methods of treatment have been discussed with the patient and family. After consideration of risks, benefits and other options for treatment, the patient has consented to  Procedure(s): ARTHROPLASTY, SHOULDER, TOTAL, REVERSE (Left) as a surgical intervention.  The patient's history has been reviewed, patient examined, no change in status, stable for surgery.  I have reviewed the patient's chart and labs.  Questions were answered to the patient's satisfaction.     Verlee Rossetti

## 2023-09-10 NOTE — Discharge Instructions (Signed)
 Ice to the shoulder constantly.  Keep the incision covered and clean and dry for one week, then ok to get it wet in the shower. May remove the bandage after one week and leave incision open to air. Leave the steristrips on please.   Do exercise as instructed several times per day.  DO NOT reach behind your back or push up out of a chair with the operative arm.  Use a sling while you are up and around for comfort, may remove while seated.  Keep pillow propped behind the operative elbow.  Follow up with Dr Ranell Patrick in two weeks in the office, call [phone] for appt  Please call Dr Ranell Patrick (cell) [phone] with any questions or concerns

## 2023-09-11 ENCOUNTER — Encounter (HOSPITAL_COMMUNITY): Payer: Self-pay | Admitting: Orthopedic Surgery

## 2023-09-11 DIAGNOSIS — S42292A Other displaced fracture of upper end of left humerus, initial encounter for closed fracture: Secondary | ICD-10-CM | POA: Diagnosis not present

## 2023-09-11 LAB — GLUCOSE, CAPILLARY: Glucose-Capillary: 212 mg/dL — ABNORMAL HIGH (ref 70–99)

## 2023-09-11 NOTE — Care Management Obs Status (Signed)
 MEDICARE OBSERVATION STATUS NOTIFICATION   Patient Details  Name: Hailey Stanton MRN: [id number] Date of Birth: 1955/06/17   Medicare Observation Status Notification Given:  Yes    Howell Rucks, RN 09/11/2023, 10:34 AM
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
## 2023-09-11 NOTE — Plan of Care (Signed)
  Problem: Education: Goal: Knowledge of General Education information will improve Description: Including pain rating scale, medication(s)/side effects and non-pharmacologic comfort measures Outcome: Adequate for Discharge   Problem: Health Behavior/Discharge Planning: Goal: Ability to manage health-related needs will improve Outcome: Adequate for Discharge   Problem: Clinical Measurements: Goal: Ability to maintain clinical measurements within normal limits will improve Outcome: Adequate for Discharge Goal: Will remain free from infection Outcome: Adequate for Discharge Goal: Diagnostic test results will improve Outcome: Adequate for Discharge Goal: Respiratory complications will improve Outcome: Adequate for Discharge Goal: Cardiovascular complication will be avoided Outcome: Adequate for Discharge   Problem: Activity: Goal: Risk for activity intolerance will decrease Outcome: Adequate for Discharge   Problem: Coping: Goal: Level of anxiety will decrease Outcome: Adequate for Discharge   Problem: Elimination: Goal: Will not experience complications related to bowel motility Outcome: Adequate for Discharge Goal: Will not experience complications related to urinary retention Outcome: Adequate for Discharge   Problem: Pain Managment: Goal: General experience of comfort will improve and/or be controlled Outcome: Adequate for Discharge   Problem: Safety: Goal: Ability to remain free from injury will improve Outcome: Adequate for Discharge   Problem: Skin Integrity: Goal: Risk for impaired skin integrity will decrease Outcome: Adequate for Discharge   Problem: Education: Goal: Ability to describe self-care measures that may prevent or decrease complications (Diabetes Survival Skills Education) will improve Outcome: Adequate for Discharge Goal: Individualized Educational Video(s) Outcome: Adequate for Discharge   Problem: Coping: Goal: Ability to adjust to condition  or change in health will improve Outcome: Adequate for Discharge   Problem: Fluid Volume: Goal: Ability to maintain a balanced intake and output will improve Outcome: Adequate for Discharge   Problem: Health Behavior/Discharge Planning: Goal: Ability to identify and utilize available resources and services will improve Outcome: Adequate for Discharge Goal: Ability to manage health-related needs will improve Outcome: Adequate for Discharge   Problem: Metabolic: Goal: Ability to maintain appropriate glucose levels will improve Outcome: Adequate for Discharge   Problem: Nutritional: Goal: Maintenance of adequate nutrition will improve Outcome: Adequate for Discharge Goal: Progress toward achieving an optimal weight will improve Outcome: Adequate for Discharge   Problem: Skin Integrity: Goal: Risk for impaired skin integrity will decrease Outcome: Adequate for Discharge   Problem: Tissue Perfusion: Goal: Adequacy of tissue perfusion will improve Outcome: Adequate for Discharge   Problem: Education: Goal: Knowledge of the prescribed therapeutic regimen will improve Outcome: Adequate for Discharge Goal: Understanding of activity limitations/precautions following surgery will improve Outcome: Adequate for Discharge   Problem: Activity: Goal: Ability to tolerate increased activity will improve Outcome: Adequate for Discharge   Problem: Pain Management: Goal: Pain level will decrease with appropriate interventions Outcome: Adequate for Discharge

## 2023-09-11 NOTE — Progress Notes (Signed)
 Orthopedics Progress Note  Subjective: Patient with minimal pain today  Objective:  Vitals:   09/11/23 0129 09/11/23 0551  BP: 126/71 (!) 153/72  Pulse: 74 76  Resp: 18 18  Temp: 97.6 F (36.4 C) 97.8 F (36.6 C)  SpO2: 96% 95%    General: Awake and alert  Musculoskeletal: Left shoulder dressing changed. Extensive skin tears noted Neurovascularly intact  Lab Results  Component Value Date   WBC 11.2 (H) 09/08/2023   HGB 14.5 09/08/2023   HCT 47.2 (H) 09/08/2023   MCV 95.2 09/08/2023   PLT 417 (H) 09/08/2023       Component Value Date/Time   NA 139 09/08/2023 0830   NA 140 09/19/2022 1337   K 3.7 09/08/2023 0830   CL 106 09/08/2023 0830   CO2 21 (L) 09/08/2023 0830   GLUCOSE 260 (H) 09/08/2023 0830   BUN 25 (H) 09/08/2023 0830   BUN 23 09/19/2022 1337   CREATININE 0.91 09/08/2023 0830   CALCIUM 9.2 09/08/2023 0830   GFRNONAA >60 09/08/2023 0830   GFRAA >90 06/13/2013 0524    Lab Results  Component Value Date   INR 0.94 06/02/2013    Assessment/Plan: POD #1 s/p Procedure(s): ARTHROPLASTY, SHOULDER, TOTAL, REVERSE Stable overnight. Hopeful discharge later today if clears PT  Almedia Balls. Ranell Patrick, MD 09/11/2023 7:59 AM

## 2023-09-11 NOTE — Op Note (Signed)
 NAME: Hailey Stanton, MCMACKIN MEDICAL RECORD NO: [id number] ACCOUNT NO: [id number] DATE OF BIRTH: Sep 03, 1955 FACILITY: [facility]ATION: [id number] PHYSICIAN: Almedia Balls. Ranell Patrick, MD  Operative Report   DATE OF PROCEDURE: 09/10/2023  PREOPERATIVE DIAGNOSIS:  Comminuted and displaced left proximal humerus fracture.  POSTOPERATIVE DIAGNOSIS:  Comminuted and displaced left proximal humerus fracture.  PROCEDURE PERFORMED:  Left reverse total shoulder arthroplasty using DePuy Delta Xtend prosthesis with fracture epi and tuberosity repair.  ATTENDING SURGEON:  Almedia Balls. Ranell Patrick, MD  ASSISTANT:  Konrad Felix Dixon, [place], who was scrubbed during the entire procedure, and necessary for satisfactory completion of surgery.  ANESTHESIA:  General anesthesia was used plus interscalene block.  ESTIMATED BLOOD LOSS:  150 mL.  FLUID REPLACEMENT:  1000 mL crystalloid.  INSTRUMENT COUNTS:  Correct.  COMPLICATIONS:  No complications.  ANTIBIOTICS:  Perioperative antibiotics were given.  INDICATIONS:  The patient is a 68 year old female who presents with a history of a hard fall injuring her left shoulder.  She presented with a comminuted and displaced proximal humerus fracture.  We discussed options including ORIF versus reverse total  shoulder arthroplasty.  Due to significant displacement in her medial shaft relative to her head and comminution that we noted on x-ray, we recommended reverse total shoulder arthroplasty to allow for early motion and quicker return to ADLs.  The patient  agreed.  Informed consent obtained.  DESCRIPTION OF PROCEDURE:  After an adequate level of general anesthesia was achieved plus interscalene block, the patient was positioned in the modified beach chair position.  The left shoulder was correctly identified and sterile prep and drape  performed.  Timeout called verifying correct patient and correct site.  We entered the patient's shoulder using a standard deltopectoral incision starting  at the coracoid process and extending down the anterior humerus.  Dissection down through the  subcutaneous tissues using Bovie.  Cephalic vein was identified and taken laterally with the deltoid.  Pectoralis was taken medially.  The conjoined tendon was identified and retracted medially.  We entered the clavipectoral fascia.  Hematoma was  evacuated, placed our deep retractors, went ahead and tenodesed the biceps in situ with 0 Vicryl figure-of-eight suture x2.  We then went ahead and used a Bovie to release the rotator interval and the biceps tendon.  We then osteotomized the lesser  tuberosity off the head and placed #2 FiberWire suture in a mattress fashion medial to the lesser tuberosity with suture limbs coming out superficial to the tuberosity with good tendon purchase.  We also osteotomized the greater tuberosity making sure it  was nice and thin for repair.  We used #2 FiberWire suture and again two mattress sutures lateral to the greater tuberosity with suture limbs coming out superficial to the greater tuberosity.  The head was removed to the back table and bone graft  harvested.  We then went ahead and retracted, removed the labrum and the biceps stump, and then the cartilage on the glenoid face, which was tiny.  We then placed our guide pin centered on the glenoid face and reamed to subchondral bone.  We did our  peripheral T-handle reamer and then drilled out our central peg hole for our HA-coated press-fit baseplate, which we impacted in position referencing off the inferior scapular neck for the 6 o'clock position.  We placed a 42 screw inferiorly and locked  that and a 30 screw superiorly at the base of the coracoid and locked that.  Due to the extremely small diameter of the glenoid, we  could not place anterior-posterior screws.  With the baseplate secured, we selected a 38 +0 standard glenosphere and  attached that to the baseplate with the screwdriver.  I did a finger sweep to make sure  we had no soft tissue caught in between the baseplate and the glenosphere.  We then went to the humeral side.  We reamed up to a size 10-mm stem and then we trialled  with the 10 stem and the one left fracture epi placed in 20 degrees of retroversion.  We marked our version.  We reduced with a 38 +3 poly trial on the humeral tray and had appropriate height on our construct on the lateral side and the tuberosity was  reduced nicely.  We removed all the trial components, irrigated thoroughly drilled holes in the shaft for repair of the tuberosities.  Two small 1.6-mm drill holes and then we placed two #2 FiberWire sutures with the suture limbs coming out of the  humeral shaft.  Next, we selected the real Porocoat 10 stem and the Porocoat 1 left fracture epi.  We attached that on the 0 setting and then we used available bone graft in impaction grafting technique and impacted the Porocoat stem in 20 degrees of  retroversion.  We had very good stem security.  It sat down nicely exactly where we had trialled it.  We selected the real 38 +3 polyethylene impacted on the humeral tray and reduced the shoulder appropriate soft tissue balancing and stability.  We then  repaired the tuberosities anatomically.  We bone grafted the proximal portion of the Porocoat stem.  We placed a round-the-world stitch medial to the lesser tuberosity and lateral to the greater tuberosity.  We did tuberosity to tuberosity sutures and  tied those first.  We then went ahead and placed two #2 FiberWire sutures in the rotator interval further securing that portion of the superior portion of the repair and then we did shaft sutures medial to the lesser tuberosity and lateral to the greater  tuberosity for shaft to tuberosity fixation.  We then did our final around-the-world stitch to compress everything down.  We had a nice repair, everything moving together as a unit throughout a full arc of motion.  We irrigated thoroughly and repaired  the  deltopectoral interval with 0 Vicryl suture followed by 2-0 Vicryl for subcutaneous closure and staples for skin.  Skin tears were noted as we were just removing the Ioban bandage from her very thin skin and we used Steri-Strips to tape that back  down in a sterile dressing.  The patient was transported to the recovery room in a stable condition in a shoulder sling, having tolerated surgery well.   PUS D: 09/10/2023 6:12:59 pm T: 09/11/2023 4:34:00 am  JOB: [id number]/ [id number]

## 2023-09-11 NOTE — Progress Notes (Signed)
   09/11/23 1038  TOC Brief Assessment  Insurance and Status Reviewed  Patient has primary care physician Yes  Home environment has been reviewed resides in private residence with spouse  Prior level of function: Independent  Prior/Current Home Services No current home services  Social Drivers of Health Review SDOH reviewed no interventions necessary  Readmission risk has been reviewed Yes  Transition of care needs no transition of care needs at this time

## 2023-09-11 NOTE — Evaluation (Signed)
 Occupational Therapy Evaluation Patient Details Name: Hailey Stanton MRN: [id number] DOB: 1955-10-04 Today's Date: 09/11/2023   History of Present Illness   Pt is a 68 y.o. female complaining of left shoulder pain after recent fall. Pain had continually increased since the beginning. X-rays in the clinic show comminuted proximal humerus fracture. Pt has tried various conservative treatments which have failed to alleviate their symptoms. pt now s/p Left reverse total shoulder arthroplasty     Clinical Impressions Pr  presents with decline in function and safety with ADLs and ADL following the above procedure. PTA pt was Ind with ADLs/selfcare, used a cane for mobility; pt recently (couple weeks ago after fall) required some assist from her husband for selfcare tasks. Pt and husband educated on shoulder protocol, weight bearing status (L UE NWB), AROM exercises allowed by MD, compensatory ADL techniques and ADL mobility safety using SPC; husband able to provide 24/7 assist.  All education completed and no further acute OT services are indicated at this time. Pt to d/c home today. OT will sign off     If plan is discharge home, recommend the following:   A little help with bathing/dressing/bathroom;A little help with walking and/or transfers     Functional Status Assessment   Patient has had a recent decline in their functional status and demonstrates the ability to make significant improvements in function in a reasonable and predictable amount of time.     Equipment Recommendations   None recommended by OT     Recommendations for Other Services         Precautions/Restrictions   Precautions Precautions: Fall;Shoulder;Other (comment) Type of Shoulder Precautions: No AROM, no PROM L shoulder Shoulder Interventions: Shoulder sling/immobilizer;At all times Precaution Booklet Issued: Yes (comment) Recall of Precautions/Restrictions: Intact Required Braces or Orthoses:  Sling Restrictions Weight Bearing Restrictions Per Provider Order: Yes LUE Weight Bearing Per Provider Order: Non weight bearing     Mobility Bed Mobility Overal bed mobility: Needs Assistance Bed Mobility: Supine to Sit     Supine to sit: Min assist, HOB elevated, Used rails     General bed mobility comments: min A to elevate trunk    Transfers Overall transfer level: Needs assistance Equipment used: Straight cane Transfers: Sit to/from Stand, Bed to chair/wheelchair/BSC Sit to Stand: Supervision           General transfer comment: husband able to assist      Balance Overall balance assessment: Mild deficits observed, not formally tested                                         ADL either performed or assessed with clinical judgement   ADL Overall ADL's : Needs assistance/impaired                                       General ADL Comments: min A required for all bathing, dressing, toileting tasks. husband ablt to provide assist 24//7 Education/instruction provided for compensatory ADL techniques     Vision Baseline Vision/History: 1 Wears glasses Ability to See in Adequate Light: 0 Adequate Patient Visual Report: No change from baseline       Perception         Praxis         Pertinent Vitals/Pain Pain Assessment Pain Assessment: 0-10  Pain Score: 4  Pain Location: L shoulder Pain Descriptors / Indicators: Sore Pain Intervention(s): Monitored during session, Premedicated before session, Repositioned     Extremity/Trunk Assessment Upper Extremity Assessment Upper Extremity Assessment: Generalized weakness;Right hand dominant;LUE deficits/detail LUE: Unable to fully assess due to immobilization           Communication Communication Communication: No apparent difficulties   Cognition Arousal: Alert Behavior During Therapy: WFL for tasks assessed/performed Cognition: No apparent impairments                                Following commands: Intact       Cueing  General Comments   Cueing Techniques: Verbal cues      Exercises Shoulder Exercises Elbow Flexion: AROM, Left, 5 reps, Standing Elbow Extension: AROM, Left, 5 reps, Standing Wrist Flexion: AROM, Left, 5 reps, Standing Wrist Extension: AROM, Left, 5 reps, Standing Digit Composite Flexion: AROM, Left, 5 reps, Standing Composite Extension: AROM, Left, 5 reps, Standing   Shoulder Instructions Shoulder Instructions Donning/doffing shirt without moving shoulder: Minimal assistance;Caregiver independent with task Method for sponge bathing under operated UE: Minimal assistance;Caregiver independent with task Donning/doffing sling/immobilizer: Minimal assistance;Caregiver independent with task Correct positioning of sling/immobilizer: Independent;Caregiver independent with task ROM for elbow, wrist and digits of operated UE: Supervision/safety;Caregiver independent with task Sling wearing schedule (on at all times/off for ADL's): Independent;Caregiver independent with task Proper positioning of operated UE when showering: Independent;Caregiver independent with task Positioning of UE while sleeping: Independent;Caregiver independent with task    Home Living Family/patient expects to be discharged to:: Private residence Living Arrangements: Spouse/significant other Available Help at Discharge: Family Type of Home: Mobile home Home Access: Stairs to enter;Ramped entrance   Entrance Stairs-Rails: Can reach both Home Layout: One level         Bathroom Toilet: Standard     Home Equipment: Agricultural consultant (2 wheels);Cane - single point;Shower seat;BSC/3in1;Grab bars - tub/shower;Hand held shower head;Adaptive equipment   Additional Comments: lift chair and has been sleeping in lift chair. pt reports spouse pulls her up by her pants when she is at doctors office chairs due to inability to power up from the surfaces       Prior Functioning/Environment Prior Level of Function : Needs assist       Physical Assist : Mobility (physical);ADLs (physical) Mobility (physical): Transfers ADLs (physical): Bathing;Dressing;Toileting;IADLs Mobility Comments: uses a cane but has additional DME ADLs Comments: needs (A) with LB dressing, help with hygiene and iadls    OT Problem List: Impaired balance (sitting and/or standing);Pain;Decreased activity tolerance;Decreased range of motion   OT Treatment/Interventions:        OT Goals(Current goals can be found in the care plan section)   Acute Rehab OT Goals Patient Stated Goal: go honme   OT Frequency:       Co-evaluation              AM-PAC OT "6 Clicks" Daily Activity     Outcome Measure Help from another person eating meals?: None Help from another person taking care of personal grooming?: A Little Help from another person toileting, which includes using toliet, bedpan, or urinal?: A Little Help from another person bathing (including washing, rinsing, drying)?: A Little Help from another person to put on and taking off regular upper body clothing?: A Little Help from another person to put on and taking off regular lower body clothing?: A Little 6 Click Score:  19   End of Session Equipment Utilized During Treatment: Gait belt  Activity Tolerance: Patient tolerated treatment well Patient left: in chair;with call bell/phone within reach;with family/visitor present  OT Visit Diagnosis: History of falling (Z91.81);Muscle weakness (generalized) (M62.81);Pain Pain - Right/Left: Left Pain - part of body: Shoulder                Time: 1610-9604 OT Time Calculation (min): 44 min Charges:  OT General Charges $OT Visit: 1 Visit OT Evaluation $OT Eval Low Complexity: 1 Low OT Treatments $Self Care/Home Management : 8-22 mins $Therapeutic Exercise: 8-22 mins    Galen Manila 09/11/2023, 11:25 AM

## 2023-09-12 ENCOUNTER — Other Ambulatory Visit: Payer: Self-pay

## 2023-09-12 NOTE — Anesthesia Postprocedure Evaluation (Signed)
 Anesthesia Post Note  Patient: Hailey Stanton  Procedure(s) Performed: ARTHROPLASTY, SHOULDER, TOTAL, REVERSE (Left: Shoulder)     Patient location during evaluation: PACU Anesthesia Type: General Level of consciousness: awake and alert Pain management: pain level controlled Vital Signs Assessment: post-procedure vital signs reviewed and stable Respiratory status: spontaneous breathing, nonlabored ventilation, respiratory function stable and patient connected to nasal cannula oxygen Cardiovascular status: blood pressure returned to baseline and stable Postop Assessment: no apparent nausea or vomiting Anesthetic complications: no   No notable events documented.              Shelton Silvas

## 2023-11-01 DIAGNOSIS — M109 Gout, unspecified: Secondary | ICD-10-CM | POA: Insufficient documentation

## 2023-11-01 DIAGNOSIS — M79602 Pain in left arm: Secondary | ICD-10-CM | POA: Insufficient documentation

## 2023-11-04 DIAGNOSIS — R29898 Other symptoms and signs involving the musculoskeletal system: Secondary | ICD-10-CM | POA: Insufficient documentation

## 2023-11-04 DIAGNOSIS — M25512 Pain in left shoulder: Secondary | ICD-10-CM | POA: Insufficient documentation

## 2023-12-16 DIAGNOSIS — R200 Anesthesia of skin: Secondary | ICD-10-CM | POA: Insufficient documentation

## 2024-01-04 DIAGNOSIS — G5602 Carpal tunnel syndrome, left upper limb: Secondary | ICD-10-CM | POA: Insufficient documentation

## 2024-02-09 ENCOUNTER — Other Ambulatory Visit: Payer: Self-pay | Admitting: Internal Medicine

## 2024-02-09 DIAGNOSIS — Z1231 Encounter for screening mammogram for malignant neoplasm of breast: Secondary | ICD-10-CM

## 2024-02-24 DIAGNOSIS — K5732 Diverticulitis of large intestine without perforation or abscess without bleeding: Secondary | ICD-10-CM | POA: Insufficient documentation

## 2024-02-24 DIAGNOSIS — D1722 Benign lipomatous neoplasm of skin and subcutaneous tissue of left arm: Secondary | ICD-10-CM | POA: Insufficient documentation

## 2024-03-04 ENCOUNTER — Telehealth: Payer: Self-pay

## 2024-03-04 NOTE — Telephone Encounter (Signed)
   Pre-operative Risk Assessment    Patient Name: Hailey Stanton  DOB: [birth year] MRN: [id number]   Date of last office visit: 05/23/23  Date of next office visit: N/A   Request for Surgical Clearance    Procedure:  colonoscopy  Date of Surgery:  Clearance 05/20/24                                Surgeon:   Surgeon's Group or Practice Name:  [facility] Phone number:  [phone] Fax number:  [phone]   Type of Clearance Requested:   - Pharmacy:  Hold Apixaban  (Eliquis ) please advise   Type of Anesthesia:  propofol    Additional requests/questions:    Bonney Annabella LITTIE Dorlene   03/04/2024, 8:06 AM

## 2024-03-09 NOTE — Telephone Encounter (Signed)
   Name: MYCHELLE KENDRA  DOB: 11/19/1955  MRN: [id number]   Primary Cardiologist: Lamar Fitch, MD  Chart reviewed as part of pre-operative protocol coverage.   Per Pharm D, patient has not had an Afib/aflutter ablation within the last 3 months, DCCV within the last 4 weeks. Patient may hold Eliquis  for 2 days prior to procedure.  Patient will not need bridging with Lovenox around procedure.    I will route this recommendation to the requesting party via Epic fax function and remove from pre-op pool. Please call with questions.  Barnie Hila, NP 03/09/2024, 12:04 PM

## 2024-03-09 NOTE — Telephone Encounter (Signed)
 Patient with diagnosis of Afib on Eliquis  for anticoagulation.    Procedure:  colonoscopy   Date of Surgery:  Clearance 05/20/24           CHA2DS2-VASc Score = 5   This indicates a 7.2% annual risk of stroke. The patient's score is based upon: CHF History: 0 HTN History: 1 Diabetes History: 1 Stroke History: 0 Vascular Disease History: 1 Age Score: 1 Gender Score: 1    CrCl 68 ml/min Platelet count 417 K  Patient has not had an Afib/aflutter ablation within the last 3 months or DCCV within the last 30 days  Per office protocol, patient can hold Eliquis  for 2 days prior to procedure.    Patient will not need bridging with Lovenox (enoxaparin) around procedure.  **This guidance is not considered finalized until pre-operative APP has relayed final recommendations.**

## 2024-03-23 ENCOUNTER — Encounter: Payer: Self-pay | Admitting: Cardiology

## 2024-03-23 ENCOUNTER — Ambulatory Visit: Attending: Cardiology | Admitting: Cardiology

## 2024-03-23 VITALS — BP 120/60 | HR 59 | Ht 64.0 in | Wt 206.0 lb

## 2024-03-23 DIAGNOSIS — E782 Mixed hyperlipidemia: Secondary | ICD-10-CM

## 2024-03-23 DIAGNOSIS — M533 Sacrococcygeal disorders, not elsewhere classified: Secondary | ICD-10-CM | POA: Insufficient documentation

## 2024-03-23 DIAGNOSIS — I10 Essential (primary) hypertension: Secondary | ICD-10-CM

## 2024-03-23 DIAGNOSIS — I480 Paroxysmal atrial fibrillation: Secondary | ICD-10-CM | POA: Diagnosis not present

## 2024-03-23 DIAGNOSIS — J410 Simple chronic bronchitis: Secondary | ICD-10-CM | POA: Diagnosis not present

## 2024-03-23 MED ORDER — FLECAINIDE ACETATE 50 MG PO TABS: 50.0000 mg | ORAL_TABLET | Freq: Two times a day (BID) | ORAL | 3 refills | Status: AC

## 2024-03-23 MED ORDER — APIXABAN 5 MG PO TABS: 5.0000 mg | ORAL_TABLET | Freq: Two times a day (BID) | ORAL | 3 refills | Status: AC

## 2024-03-23 MED ORDER — DILTIAZEM HCL ER COATED BEADS 360 MG PO CP24
360.0000 mg | ORAL_CAPSULE | Freq: Every day | ORAL | 3 refills | Status: AC
Start: 2024-03-23 — End: ?

## 2024-03-23 NOTE — Patient Instructions (Signed)
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
## 2024-04-19 ENCOUNTER — Ambulatory Visit

## 2024-04-22 ENCOUNTER — Encounter (HOSPITAL_BASED_OUTPATIENT_CLINIC_OR_DEPARTMENT_OTHER): Payer: Self-pay | Admitting: Radiology

## 2024-04-22 ENCOUNTER — Inpatient Hospital Stay (HOSPITAL_BASED_OUTPATIENT_CLINIC_OR_DEPARTMENT_OTHER): Admission: RE | Admit: 2024-04-22 | Discharge: 2024-04-22 | Attending: Internal Medicine | Admitting: Internal Medicine

## 2024-04-22 DIAGNOSIS — Z1231 Encounter for screening mammogram for malignant neoplasm of breast: Secondary | ICD-10-CM | POA: Diagnosis not present

## 2024-06-01 ENCOUNTER — Other Ambulatory Visit: Payer: Self-pay | Admitting: Cardiology
